# Patient Record
Sex: Female | Born: 1980 | Race: Black or African American | Hispanic: No | Marital: Single | Smoking: Never smoker
Health system: Southern US, Community
[De-identification: ages and names within clinical notes are randomized; demographics above are authoritative.]

## PROBLEM LIST (undated history)

## (undated) DIAGNOSIS — J339 Nasal polyp, unspecified: Secondary | ICD-10-CM

## (undated) DIAGNOSIS — F324 Major depressive disorder, single episode, in partial remission: Secondary | ICD-10-CM

## (undated) DIAGNOSIS — Z8489 Family history of other specified conditions: Secondary | ICD-10-CM

## (undated) DIAGNOSIS — G4733 Obstructive sleep apnea (adult) (pediatric): Secondary | ICD-10-CM

## (undated) DIAGNOSIS — K219 Gastro-esophageal reflux disease without esophagitis: Secondary | ICD-10-CM

## (undated) DIAGNOSIS — J45909 Unspecified asthma, uncomplicated: Secondary | ICD-10-CM

## (undated) HISTORY — DX: Major depressive disorder, single episode, in partial remission: F32.4

## (undated) HISTORY — DX: Unspecified asthma, uncomplicated: J45.909

## (undated) HISTORY — DX: Obstructive sleep apnea (adult) (pediatric): G47.33

## (undated) HISTORY — PX: EYE SURGERY: SHX253

## (undated) HISTORY — PX: FRACTURE SURGERY: SHX138

## (undated) HISTORY — DX: Nasal polyp, unspecified: J33.9

## (undated) HISTORY — DX: Morbid (severe) obesity due to excess calories: E66.01

## (undated) HISTORY — PX: NASAL POLYP EXCISION: SHX2068

---
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
## 2017-11-21 ENCOUNTER — Other Ambulatory Visit: Payer: Self-pay

## 2017-11-22 ENCOUNTER — Encounter

## 2017-11-22 ENCOUNTER — Encounter: Payer: Self-pay | Admitting: Internal Medicine

## 2017-11-22 ENCOUNTER — Ambulatory Visit (INDEPENDENT_AMBULATORY_CARE_PROVIDER_SITE_OTHER): Payer: 59 | Admitting: Internal Medicine

## 2017-11-22 DIAGNOSIS — G4733 Obstructive sleep apnea (adult) (pediatric): Secondary | ICD-10-CM | POA: Diagnosis not present

## 2017-11-22 DIAGNOSIS — F324 Major depressive disorder, single episode, in partial remission: Secondary | ICD-10-CM | POA: Insufficient documentation

## 2017-11-22 DIAGNOSIS — J4530 Mild persistent asthma, uncomplicated: Secondary | ICD-10-CM

## 2017-11-22 DIAGNOSIS — J339 Nasal polyp, unspecified: Secondary | ICD-10-CM

## 2017-11-22 DIAGNOSIS — F3341 Major depressive disorder, recurrent, in partial remission: Secondary | ICD-10-CM

## 2017-11-22 DIAGNOSIS — J45909 Unspecified asthma, uncomplicated: Secondary | ICD-10-CM | POA: Insufficient documentation

## 2017-11-22 DIAGNOSIS — E669 Obesity, unspecified: Secondary | ICD-10-CM | POA: Insufficient documentation

## 2017-11-22 MED ORDER — ALBUTEROL SULFATE HFA 108 (90 BASE) MCG/ACT IN AERS: 1.0000 | INHALATION_SPRAY | Freq: Four times a day (QID) | RESPIRATORY_TRACT | 3 refills | Status: DC | PRN

## 2017-11-22 MED ORDER — MONTELUKAST SODIUM 10 MG PO TABS: 10.0000 mg | ORAL_TABLET | Freq: Every day | ORAL | 3 refills | Status: DC

## 2017-11-22 MED ORDER — FLUTICASONE PROPIONATE 50 MCG/ACT NA SUSP: 2.0000 | Freq: Every day | NASAL | 12 refills | Status: DC

## 2017-11-22 NOTE — Patient Instructions (Signed)
Please check into the bariatric surgery program with [facility].

## 2017-11-22 NOTE — Assessment & Plan Note (Signed)
Will try fluticasone spray

## 2017-11-22 NOTE — Assessment & Plan Note (Signed)
Didn't do well with the CPAP Will pursue weight loss through surgery for now

## 2017-11-22 NOTE — Assessment & Plan Note (Signed)
Doing okay for now Discussed continuing the lexapro indefinitely Try to use the alprazolam prn only

## 2017-11-22 NOTE — Assessment & Plan Note (Signed)
Has considered surgery in the past---but now ready to pursue this Multiple co-morbidities and has tried numerous programs without lasting success Surgery is clearly indicated at this point Asked her to contact [facility] to look into their program

## 2017-11-22 NOTE — Assessment & Plan Note (Signed)
Mild and persistent---but mostly at night Hard to tell how much of this is the untreated sleep apnea Will try montelukast for now

## 2017-11-22 NOTE — Progress Notes (Signed)
Subjective:    Patient ID: Robyn Jennings, female    DOB: Jul 23, 1980, 37 y.o.   MRN: [id number]  HPI Here to establish care I see her parents  Has sleep apnea--stopped using the machine due to cleanliness Was getting lots of URIs from it Stopped 2 years ago  Has history of nasal polyps and asthma Uses the inhaler 1-2 times a day--mostly at night Doesn't remember steroid inhalers but has had montelukast (but never persistent Rx  Battling morbid obesity all her life Has lost over 100# twice--but has gained it back Is considering bariatric surgery Has tried Weight Watchers and other programs Has tried comprehensive programs--but results are not long lasting  Has history of depression and anxiety Has been seeing a therapist---keeping up with them via telecommunications Depression started in college Was admitted 2 years ago (suicidal ideation, etc) Has been using the alprazolam daily in the morning lately---with increased work stress  Has had symptomatic hypoglycemia in past Like after lunch  Current Outpatient Medications on File Prior to Visit  Medication Sig Dispense Refill  . albuterol (PROVENTIL HFA;VENTOLIN HFA) 108 (90 Base) MCG/ACT inhaler Inhale 1 puff into the lungs 4 (four) times daily as needed.    . ALPRAZolam (XANAX) 0.25 MG tablet Take 0.25 mg by mouth 2 (two) times daily as needed for anxiety.    Marland Kitchen EPINEPHrine (EPIPEN 2-PAK IJ) Inject as directed.    . escitalopram (LEXAPRO) 20 MG tablet Take 1 tablet by mouth daily.     No current facility-administered medications on file prior to visit.     Allergies  Allergen Reactions  . Other Anaphylaxis    Nuts--All Types  . Bupropion   . Cetirizine   . Venlafaxine     Past Medical History:  Diagnosis Date  . Asthma   . Major depression in partial remission (HCC)   . Morbid obesity (HCC)   . Nasal polyposis   . Obstructive sleep apnea     Past Surgical History:  Procedure Laterality Date  . ANKLE SURGERY Left  2003   drilling for new collagen  . NASAL POLYP EXCISION      Family History  Problem Relation Age of Onset  . Lupus Father   . Heart disease Father   . Diabetes Maternal Grandmother   . Heart disease Paternal Grandmother   . Lupus Brother   . Cancer Neg Hx     Social History   Socioeconomic History  . Marital status: Single    Spouse name: Not on file  . Number of children: 0  . Years of education: Not on file  . Highest education level: Not on file  Occupational History  . 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 Financial resource strain: Not on file  . Food insecurity:    Worry: Not on file    Inability: Not on file  . Transportation needs:    Medical: Not on file    Non-medical: Not on file  Tobacco Use  . Smoking status: Never Smoker  . Smokeless tobacco: Never Used  Substance and Sexual Activity  . Alcohol use: Not on file  . Drug use: Not on file  . Sexual activity: Not on file  Lifestyle  . Physical activity:    Days per week: Not on file    Minutes per session: Not on file  . Stress: Not on file  Relationships  . Social connections:    Talks  on phone: Not on file    Gets together: Not on file    Attends religious service: Not on file    Active member of club or organization: Not on file    Attends meetings of clubs or organizations: Not on file    Relationship status: Not on file  . Intimate partner violence:    Fear of current or ex partner: Not on file    Emotionally abused: Not on file    Physically abused: Not on file    Forced sexual activity: Not on file  Other Topics Concern  . Not on file  Social History Narrative  . Not on file   Review of Systems  Constitutional: Negative for fatigue.       Wears seat belt  HENT: Negative for dental problem and hearing loss.   Eyes: Negative for visual disturbance.       No diplopia or unilateral vision loss  Respiratory: Positive  for wheezing. Negative for cough and shortness of breath.   Cardiovascular: Negative for chest pain and leg swelling.       Palpitations only with severe anxiety  Gastrointestinal: Negative for abdominal pain, blood in stool and constipation.       Occasional heartburn---tums twice a week or so  Endocrine: Negative for polydipsia and polyuria.  Genitourinary: Negative for dysuria and hematuria.       Periods regular Abstinent   Musculoskeletal: Positive for arthralgias and back pain. Negative for joint swelling.       Right knee is worst place  Skin:       Hyperpigmentation on cheeks--uses a skin care regimen  Allergic/Immunologic: Positive for environmental allergies. Negative for immunocompromised state.  Neurological: Negative for dizziness, syncope, light-headedness and headaches.  Hematological: Negative for adenopathy. Does not bruise/bleed easily.  Psychiatric/Behavioral: Positive for dysphoric mood and sleep disturbance. The patient is nervous/anxious.        Objective:   Physical Exam  Constitutional: No distress.  HENT:  Mouth/Throat: Oropharynx is clear and moist. No oropharyngeal exudate.  Inflammation on left No clear polyp  Neck: No thyromegaly present.  Cardiovascular: Normal rate, regular rhythm, normal heart sounds and intact distal pulses. Exam reveals no gallop.  No murmur heard. Respiratory: Effort normal. No respiratory distress. She has no wheezes. She has no rales.  Slightly decreased breath sounds but clear Not tight  GI: Soft. There is no tenderness.  Musculoskeletal: She exhibits no edema or tenderness.  Lymphadenopathy:    She has no cervical adenopathy.  Skin: No rash noted. No erythema.  Psychiatric: She has a normal mood and affect. Her behavior is normal.           Assessment & Plan:

## 2017-12-26 ENCOUNTER — Telehealth: Payer: Self-pay | Admitting: Internal Medicine

## 2017-12-26 NOTE — Telephone Encounter (Signed)
Pt's father dropped off ppw to be filled out for [facility]. Advised that Alphonsus SiasLetvak is out of office until next week. Placed forms in RX tower.

## 2017-12-27 NOTE — Telephone Encounter (Signed)
Pt returned call to shannon. Pt stated a voicemail can be left.

## 2017-12-27 NOTE — Telephone Encounter (Signed)
Left message for pt to call office. I need to know weight loss attempts including dates, results, medications, diets, exercise modifications used.

## 2018-01-03 NOTE — Telephone Encounter (Signed)
Spoke to pt. Advised her I needed a list of diets, medications, and exercise programs she has tried and failed. She will write them down and send them to me in MyChart. I told her the more detail the better.

## 2018-01-12 NOTE — Telephone Encounter (Signed)
Forms up front ready for pick up.

## 2018-02-01 ENCOUNTER — Encounter: Payer: 59 | Attending: General Surgery | Admitting: Dietician

## 2018-02-01 VITALS — Ht 69.0 in | Wt >= 6400 oz

## 2018-02-01 DIAGNOSIS — E669 Obesity, unspecified: Secondary | ICD-10-CM | POA: Diagnosis not present

## 2018-02-01 NOTE — Patient Instructions (Addendum)
   Look into finding a multivitamin and vitamin D supplement if you have not already.   Begin working through the [employer] discussed today. We will check in next month and see the progress you have made!

## 2018-02-01 NOTE — Progress Notes (Signed)
Bariatric Pre-Op Nutrition Assessment Medical Nutrition Therapy  Appt Start Time: 2:00pm  End time: 3:10pm  Patient was seen on 02/01/2018 for Pre-Operative Nutrition Assessment. Assessment and letter of approval faxed to [facility] Bariatric Surgery Program coordinator on 02/01/2018.   Planned surgery: RYGB Pt expectation of surgery: Longevity with keeping weight off.  Pt expectation of dietitian: None stated.   Anthropometrics  Start weight at [facility]: 456.4 lbs (date: 02/01/2018) Height: 69 in BMI: 67.4 kg/m2    Clinical  Medical Hx: obesity, sleep apnea, asthma Surgeries: nasal polyp removal (2009), ankle surgery (2003) Medications: naproxen sodium, albuterol inhaler, alprazolam, lexapro, epi pen Allergies: nuts, fluticasone-salmeterol, cetirizine, bupropion, venlafaxine, Advair Diskus   Psychosocial/Lifestyle Pt received her graduate degree in education. Pt works as a Building surveyor. Pt lives alone but visits her parents often. Pt states she struggles with being the only one in her family who is obese, because her brothers and parents have a hard time relating to her and her weight. Pt has an extensive hx of dieting/trying weight loss programs through which she achieved weight loss but subsequent regain. Pt is kind, states she likes structure and goal setting, and is excited to have surgery.   24-Hr Dietary Recall First Meal: 3 scrambled eggs + sausage patties + water  Snack: none Second Meal: whole wheat penne pasta + meat sauce + green beans + pears + water Snack: cheese + crackers (or strawberries + pretzels, or celery + sunbutter)  Third Meal: beef (or chicken)  Snack: 1 Oreo (or a cookie)  Beverages: water + Minute Maid Peach Punch + Dr. Reino Kent + OJ + hot tea w/ honey   Food & Nutrition Related Hx Dietary Hx: Pt states she will usually eat out for dinner throughout the week and once on the weekends with her parents. Pt states she has done better with packing her  lunch for work (or eating what is provided at work) and eating breakfast at home rather than picking up fast food. 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 Pt states she sometimes drinks a Dr. Reino Kent with dinner, or Minute Maid juice/OJ with breakfast. Pt states she stopped drinking coffee recently. Pt states a cookie for dessert satisfies in the evening after dinner.  Estimated Daily Fluid Intake: 64 oz water + other beverages  Supplements: none GI / Other Notable Symptoms: heartburn (triggered by spicy foods, movement soon after eating)    Physical Activity  Current average weekly physical activity: none  Estimated Energy Needs Calories: 1600 Carbohydrate: 180g Protein: 120g Fat: 44g  Pre-Op Goals Reviewed with the Patient . Track food and beverage intake (try MyFitness Pal or the Baritastic app) . Make healthy food choices while monitoring portion sizes . Avoid concentrated sugars and fried foods . Keep fat & sugar in the single digits per serving on food labels . Practice CHEWING your food (aim for applesauce consistency) . Practice not drinking 15 minutes before, during, and 30 minutes after each meal and snack . Avoid all carbonated beverages (ex: soda, sparkling beverages)  . Limit caffeinated beverages (ex: coffee, tea, energy drinks) . Avoid all sugar-sweetened beverages (ex: regular soda, sports drinks)  . Avoid alcohol  . Consume 3 meals per day or try to eat every 3-5 hours . Make a list of non-food related activities .  Aim for 64-100 ounces of FLUID daily (with at least half of fluid intake being plain water)  . Aim for at least 60-80 grams of PROTEIN daily . Look for a liquid protein source that contains ?15 g protein and ?5 g carbohydrate (ex: shakes, drinks, shots) . Physical activity is an  important part of a healthy lifestyle so keep it moving! The goal is to reach 150 minutes of exercise per week, including cardiovascular and weight baring activity.  Handouts Provided Include  . Bariatric Surgery handouts (Nutrition Visits, Pre-Op Goals, Protein Shakes, Vitamins & Minerals, Support Group 2020 Schedule)  Learning Style & Readiness for Change Teaching method utilized: Visual & Auditory  Demonstrated degree of understanding via: Teach Back  Barriers to learning/adherence to lifestyle change: None Identified  RD's Notes for Next Visit . Assess progress made on Pre-Op Goals. Pt did not specify which goals she would like to start with during the visit, so check in and see.  . Provide meal and snack ideas, and general healthful nutrition education. During assessment, pt expressed her fear of gaining weight before surgery (which, per insurance, could disqualify her from having surgery.) Reinforce healthful habits and provide counseling as needed.   Next Steps Supervised Weight Loss (SWL) Visits Needed: 3  Patient is to return to [facility] in 1 month for 1st SWL Visit.  Patient is to call  [facility] to enroll in Pre-Op Class (>2 weeks before surgery) and Post-Op Class (2 weeks after surgery) for further nutrition education when surgery date is scheduled.

## 2018-02-05 ENCOUNTER — Ambulatory Visit (INDEPENDENT_AMBULATORY_CARE_PROVIDER_SITE_OTHER): Payer: 59

## 2018-02-05 ENCOUNTER — Other Ambulatory Visit: Payer: Self-pay

## 2018-02-05 ENCOUNTER — Ambulatory Visit
Admission: EM | Admit: 2018-02-05 | Discharge: 2018-02-05 | Disposition: A | Discharge status: Home / Self Care | Payer: 59 | Attending: Family Medicine | Admitting: Family Medicine

## 2018-02-05 ENCOUNTER — Telehealth: Payer: Self-pay | Admitting: Family Medicine

## 2018-02-05 DIAGNOSIS — M25562 Pain in left knee: Secondary | ICD-10-CM

## 2018-02-05 DIAGNOSIS — R030 Elevated blood-pressure reading, without diagnosis of hypertension: Secondary | ICD-10-CM | POA: Diagnosis not present

## 2018-02-05 MED ORDER — MELOXICAM 15 MG PO TABS: 15.0000 mg | ORAL_TABLET | Freq: Every day | ORAL | 0 refills | Status: DC | PRN

## 2018-02-05 NOTE — Discharge Instructions (Signed)
Xray was negative.  Rest. Try to avoid bending/stooping.  Medication as prescribed. If persists and does not improve you may need MRI to assess for meniscal injury. I would recommend seeing [facility] if it persists ([address] [place])  Take care  Dr. Adriana Simas

## 2018-02-05 NOTE — ED Provider Notes (Addendum)
[facility]    CSN: [id number] Arrival date & time: 02/05/18  0804  History   Chief Complaint Chief Complaint  Patient presents with  . Knee Pain   HPI  38 year old female presents with knee pain.  Patient reports a one-week history of left knee pain.  Patient reports that it started after she got out of bed and twisted her knee inward.  Patient states that her pain has continued to persist since that time.  She states that she is now having left calf pain as well.  No recent long travel.  She has tried Motrin, Tylenol, and elevation without improvement.  Exacerbated by activity.  Currently 8/10 in severity.  No relieving factors.  No other associated symptoms.  No other complaints.  PMH, Surgical Hx, Family Hx, Social History reviewed and updated as below.  Past Medical History:  Diagnosis Date  . Asthma   . Major depression in partial remission (HCC)   . Morbid obesity (HCC)   . Nasal polyposis   . Obstructive sleep apnea    Patient Active Problem List   Diagnosis Date Noted  . Asthma   . Morbid obesity (HCC)   . Obstructive sleep apnea   . Major depression in partial remission (HCC)   . Nasal polyposis    Past Surgical History:  Procedure Laterality Date  . ANKLE SURGERY Left 2003   drilling for new collagen  . EYE SURGERY Bilateral    PRK  . NASAL POLYP EXCISION      OB History   No obstetric history on file.    Home Medications    Prior to Admission medications   Medication Sig Start Date End Date Taking? Authorizing Provider  cholecalciferol (VITAMIN D3) 25 MCG (1000 UT) tablet Take 1,000 Units by mouth daily.   Yes [provider]  Multiple Vitamins-Minerals (WOMENS MULTIVITAMIN) TABS Take by mouth.   Yes [provider]  albuterol (PROVENTIL HFA;VENTOLIN HFA) 108 (90 Base) MCG/ACT inhaler Inhale 1 puff into the lungs 4 (four) times daily as needed. 11/22/17 01/23/19  Karie SchwalbeLetvak, Richard I, MD  ALPRAZolam Prudy Feeler(XANAX) 0.25 MG tablet  Take 0.25 mg by mouth 2 (two) times daily as needed for anxiety.    [provider]  EPINEPHrine (EPIPEN 2-PAK IJ) Inject as directed.    [provider]  escitalopram (LEXAPRO) 20 MG tablet Take 1 tablet by mouth daily. 06/15/17   [provider]  fluticasone (FLONASE) 50 MCG/ACT nasal spray Place 2 sprays into both nostrils daily. In each nostril 11/22/17   Karie SchwalbeLetvak, Richard I, MD  montelukast (SINGULAIR) 10 MG tablet Take 1 tablet (10 mg total) by mouth at bedtime. 11/22/17   Karie SchwalbeLetvak, Richard I, MD    Family History Family History  Problem Relation Age of Onset  . Lupus Father   . Heart disease Father   . Diabetes Maternal Grandmother   . Heart disease Paternal Grandmother   . Lupus Brother   . Cancer Neg Hx     Social History Social History   Tobacco Use  . Smoking status: Never Smoker  . Smokeless tobacco: Never Used  Substance Use Topics  . Alcohol use: Not Currently  . Drug use: Never     Allergies   Other; Bupropion; Cetirizine; and Venlafaxine   Review of Systems Review of Systems  Constitutional: Negative.   Musculoskeletal:       Left knee pain; calf pain.   Physical Exam Triage Vital Signs ED Triage Vitals  Enc Vitals  Group     BP 02/05/18 0817 (!) 155/97     Pulse Rate 02/05/18 0817 78     Resp 02/05/18 0817 18     Temp 02/05/18 0817 98.1 F (36.7 C)     Temp Source 02/05/18 0817 Oral     SpO2 02/05/18 0817 100 %     Weight --      Height --      Head Circumference --      Peak Flow --      Pain Score 02/05/18 0816 8     Pain Loc --      Pain Edu? --      Excl. in GC? --    Updated Vital Signs BP (!) 155/97 (BP Location: Right Arm)   Pulse 78   Temp 98.1 F (36.7 C) (Oral)   Resp 18   LMP 01/31/2018   SpO2 100%   Visual Acuity Right Eye Distance:   Left Eye Distance:   Bilateral Distance:    Right Eye Near:   Left Eye Near:    Bilateral Near:     Physical Exam Vitals signs and nursing note reviewed.    Constitutional:      General: She is not in acute distress. HENT:     Head: Normocephalic and atraumatic.     Right Ear: Tympanic membrane normal.     Left Ear: Tympanic membrane normal.  Cardiovascular:     Rate and Rhythm: Normal rate and regular rhythm.  Pulmonary:     Effort: Pulmonary effort is normal.     Breath sounds: No wheezing, rhonchi or rales.  Musculoskeletal:     Comments: Left knee -technically difficult exam due to body habitus.  No appreciable areas of tenderness.  No erythema.  No apparent effusion.  Neurological:     Mental Status: She is alert.  Psychiatric:        Mood and Affect: Mood normal.        Behavior: Behavior normal.    UC Treatments / Results  Labs (all labs ordered are listed, but only abnormal results are displayed) Labs Reviewed - No data to display  EKG None  Radiology Dg Knee Complete 4 Views Left  Result Date: 02/05/2018 CLINICAL DATA:  Twisted left knee 10 days ago and heard a pop. Still c/o pain. EXAM: LEFT KNEE - COMPLETE 4+ VIEW COMPARISON:  None. FINDINGS: No evidence of fracture, dislocation, or joint effusion. No evidence of arthropathy or other focal bone abnormality. Soft tissues are unremarkable. IMPRESSION: Negative. Electronically Signed   By: Bary RichardStan  Maynard M.D.   On: 02/05/2018 08:41    Procedures Procedures (including critical care time)  Medications Ordered in UC Medications - No data to display  Initial Impression / Assessment and Plan / UC Course  I have reviewed the triage vital signs and the nursing notes.  Pertinent labs & imaging results that were available during my care of the patient were reviewed by me and considered in my medical decision making (see chart for details).    38 year old female presents with acute left knee pain.  X-rays negative.  Advised rest and avoidance of activities that may worsen symptoms.  Meloxicam as prescribed.  If she fails improve or worsens, she should see orthopedics as  outlined in my after visit instructions.  Final Clinical Impressions(s) / UC Diagnoses   Final diagnoses:  Acute pain of left knee     Discharge Instructions     Xray was negative.  Rest. Try to avoid bending/stooping.  Medication as prescribed. If persists and does not improve you may need MRI to assess for meniscal injury. I would recommend seeing [facility] if it persists ([address] [place])  Take care  Dr. Adriana Simas    ED Prescriptions    None     Controlled Substance Prescriptions [place] Controlled Substance Registry consulted? Not Applicable   Tommie Sams, DO 02/05/18 0913    Tommie Sams, DO 02/05/18 1312

## 2018-02-05 NOTE — Telephone Encounter (Signed)
Rx sent (apparently did not get sent during encounter).  Everlene Other DO [facility]

## 2018-02-05 NOTE — ED Triage Notes (Signed)
Pt was getting out of bed this week and her left knee twisted and she heard cracking (she reports the cracking happens a lot). Limping gait in triage. Pain 8/10

## 2018-03-04 ENCOUNTER — Other Ambulatory Visit: Payer: Self-pay | Admitting: Family Medicine

## 2018-03-06 ENCOUNTER — Encounter: Payer: Self-pay | Admitting: Dietician

## 2018-03-06 ENCOUNTER — Other Ambulatory Visit: Payer: Self-pay | Admitting: Internal Medicine

## 2018-03-06 ENCOUNTER — Encounter: Payer: 59 | Attending: General Surgery | Admitting: Dietician

## 2018-03-06 VITALS — Wt >= 6400 oz

## 2018-03-06 DIAGNOSIS — E669 Obesity, unspecified: Secondary | ICD-10-CM | POA: Insufficient documentation

## 2018-03-06 NOTE — Patient Instructions (Addendum)
Continue working through the [employer], focusing on these goals this next month:   Aim for 60 grams of protein per day (getting in more earlier in the day with breakfast/lunch/snacks and from lean sources)   Practice not drinking 15 minutes before, during, and 30 minutes after each meal and snack  Increase physical activity throughout the week. The goal is to reach 150 minutes of exercise per week, including cardiovascular and weight baring activity.  Keep up the GREAT work! Amazing what you have accomplished within the last month.

## 2018-03-06 NOTE — Progress Notes (Signed)
Bariatric Supervised Weight Loss Visit Appt Start Time: 4:50pm  End Time: 5:35pm  Planned Surgery: RYGB   1st out of 3 SWL Appointments   NUTRITION ASSESSMENT  Anthropometrics  Start weight at [facility]: 456.4 lbs (date: 02/01/2018) Today's weight: 455.8 lbs Weight change: -0.6 lbs (since previous visit on 02/01/2018) BMI: 67.28 kg/m2    Clinical  Medical Hx: obesity, sleep apnea, asthma Medications: see list  Psychosocial/Lifestyle Pt received her graduate degree in education. Pt works as a Building surveyor. Pt lives alone but visits her parents often. Pt states she struggles with being the only one in her family who is obese, because her brothers and parents have a hard time relating to her and her weight. Pt has an extensive hx of dieting/trying weight loss programs through which she achieved weight loss but subsequent regain. Pt is kind, states she likes structure and goal setting, and is excited to have surgery.   24-Hr Dietary Recall First Meal: egg muffin (2 eggs + onions + green peppers + sausage + mozzarella cheese) (or protein shake + soy milk)  Snack: skips Second Meal: baked chicken + mashed sweet potatoes + pineapple + broccoli + water Snack: cheese + crackers (hummus + carrots)  Third Meal: pork chop + salad + vegetables  Snack: skips  Beverages: water + coffee + Crystal Light   Food & Nutrition Related Hx Dietary Hx: Pt states she has been eating out a lot less frequently, only eating out dinner with her family on the weekends. Pt states she is continuing to improve on packing her lunch for work and making good food choices at work when she does eat school lunch. Pt states she only drinks soda 1 to 2 times per week. Pt states she only drinks coffee twice per week, and she really enjoys it now (whereas before she just drank it for caffeine.) Pt states her family always eats pizza on Fridays, but recently they instead went to a sit down restaurant with healthy options when pt let  her family know she was working on making dietary changes. Pt states she would like to incorporate more protein so she can meet her 60g/day goal, ideally adding more into breakfast/lunch. Pt states she feels better by eating breakfast, lunch, and a late dinner, and that making good food choices helps her feel good the next day.  Estimated Daily Fluid Intake: 64+ oz Supplements: Women's One a Day MVI, vitamin D3 GI / Other Notable Symptoms: none stated   Physical Activity  Current average weekly physical activity: gym once/week (fitness center at apartment); plans to start swimming twice per week   Estimated Energy Needs Calories: 1600 Carbohydrate: 180g Protein: 120g Fat: 44g   NUTRITION DIAGNOSIS  Overweight/obesity ([place]-3.3) related to past poor dietary habits and physical inactivity as evidenced by patient w/ planned RYGB surgery following dietary guidelines for continued weight loss.   NUTRITION INTERVENTION  Nutrition counseling (C-1) and education (E-2) to facilitate bariatric surgery goals.  Pre-Op Goals Progress & New Goals . Less inflammation/ feels better by eating homemade food  . Less coffee and soda  . Working out a few times a month (going to fitness center at apartment)  . Going to start the pool 2x/week  . Tracking food intake  . Started taking MVI and vitamin D3 . GOAL: Planning protein in the beginning of the day (from lean sources) and hitting 60g/day  . GOAL: Practice not drinking 15 minutes before, during, and 30 minutes after each meal and snack  .  GOAL: To reach 150 minutes of exercise per week, including cardiovascular and weight baring activity  Handouts Provided Include   MyPlate   Meal Ideas   Breakfast Ideas (per pt request)   Plant Protein   Types of Fat (per pt request)   Learning Style & Readiness for Change Teaching method utilized: Visual & Auditory  Demonstrated degree of understanding via: Teach Back  Barriers to learning/adherence to  lifestyle change: None Identified   RD's Notes for next Visit   Check in on progress made with new goals set. Pt likes structure and accountability.   During assessment, pt expressed her fear of gaining weight before surgery (which, per insurance, could disqualify her from having surgery.) Reinforce healthful habits and provide counseling as needed.    MONITORING & EVALUATION Dietary intake, weekly physical activity, body weight, and pre-op goals in 1 month.   Next Steps  Patient is to return to [facility] in 1 month for 2nd SWL.

## 2018-03-16 ENCOUNTER — Ambulatory Visit: Payer: 59 | Admitting: Family Medicine

## 2018-03-16 ENCOUNTER — Ambulatory Visit
Admission: RE | Admit: 2018-03-16 | Discharge: 2018-03-16 | Disposition: A | Discharge status: Home / Self Care | Payer: 59 | Source: Ambulatory Visit | Attending: Family Medicine | Admitting: Family Medicine

## 2018-03-16 ENCOUNTER — Encounter: Payer: Self-pay | Admitting: Family Medicine

## 2018-03-16 ENCOUNTER — Other Ambulatory Visit: Payer: Self-pay

## 2018-03-16 VITALS — BP 128/80 | HR 73 | Temp 98.6°F | Ht 69.0 in

## 2018-03-16 DIAGNOSIS — M79669 Pain in unspecified lower leg: Secondary | ICD-10-CM

## 2018-03-16 MED ORDER — TIZANIDINE HCL 4 MG PO TABS: 4.0000 mg | ORAL_TABLET | Freq: Four times a day (QID) | ORAL | 0 refills | Status: DC | PRN

## 2018-03-16 NOTE — Patient Instructions (Signed)
Go see Shirlee Limerick on the way out.  Assuming your ultrasound is negative, then start using tizanidine for calf pain and use ice on the back of your knee.   Take care.  Glad to see you.

## 2018-03-16 NOTE — Progress Notes (Signed)
L knee pain.  About 1.5 months ago the pain started.  Seen at [facility], started on meloxicam, did well for about 3 weeks. Worse in the last week.  Pain getting out of bed.  Pain sitting down.  Pain bending the knee.  Locally puffy.  No bruising.  Posterior pain, popliteal area, and also anterior at the knee cap.  No trauma.  H/o audible knee popping at baseline, longstanding.    FH DVT but patient has no hx.    Meds, vitals, and allergies reviewed.   ROS: Per HPI unless specifically indicated in ROS section   nad obese ncat rrr ctab abd soft.  B 65cm calf circumference.   L knee ttp anterior and posterior but joint line not ttp.  No crepitus on ROM L knee Normal ROM  L knee.  L calf ttp w/o bruising.

## 2018-03-18 ENCOUNTER — Encounter: Payer: Self-pay | Admitting: Nurse Practitioner

## 2018-03-18 ENCOUNTER — Ambulatory Visit: Payer: 59 | Admitting: Nurse Practitioner

## 2018-03-18 VITALS — BP 226/78 | HR 64 | Temp 98.0°F | Resp 16

## 2018-03-18 DIAGNOSIS — M25562 Pain in left knee: Secondary | ICD-10-CM

## 2018-03-18 DIAGNOSIS — S8992XD Unspecified injury of left lower leg, subsequent encounter: Secondary | ICD-10-CM

## 2018-03-18 MED ORDER — TRAMADOL HCL 50 MG PO TABS: 50.0000 mg | ORAL_TABLET | Freq: Two times a day (BID) | ORAL | 0 refills | Status: AC | PRN

## 2018-03-18 NOTE — Progress Notes (Signed)
Subjective:  Patient ID: Robyn Jennings, female    DOB: August 20, 1980  Age: 38 y.o. MRN: [id number]  CC: Leg Pain (Complaind of left leg pain,more behind the knee. This started about 2 weeks ago, was seen Thursday, pain not better. )  Knee Pain   The incident occurred more than 1 week ago. The incident occurred at home. The injury mechanism was a twisting injury. The pain is present in the left knee. The quality of the pain is described as aching. The pain has been constant since onset. Associated symptoms include an inability to bear weight. Pertinent negatives include no loss of motion, loss of sensation, muscle weakness, numbness or tingling. The symptoms are aggravated by palpation, weight bearing and movement. She has tried NSAIDs for the symptoms. The treatment provided mild relief.    knee injury 3weeks ago, heard loud pop sound when knee twisted. Worsening pain. Worse with prolong stand and bending knee. No recent travel, no immobilization minimal improvement with meloxicam. No improvement with muscle relaxant. Normal knee x-ray and negative venous doppler.  Reviewed past Medical, Social and Family history today.  Outpatient Medications Prior to Visit  Medication Sig Dispense Refill  . albuterol (PROVENTIL HFA;VENTOLIN HFA) 108 (90 Base) MCG/ACT inhaler Inhale 1 puff into the lungs 4 (four) times daily as needed. 18 g 3  . ALPRAZolam (XANAX) 0.25 MG tablet Take 0.25 mg by mouth 2 (two) times daily as needed for anxiety.    . cholecalciferol (VITAMIN D3) 25 MCG (1000 UT) tablet Take 1,000 Units by mouth daily.    Marland Kitchen EPINEPHrine (EPIPEN 2-PAK IJ) Inject as directed.    . escitalopram (LEXAPRO) 20 MG tablet Take 1 tablet by mouth daily.    . fluticasone (FLONASE) 50 MCG/ACT nasal spray Place 2 sprays into both nostrils daily. In each nostril 16 g 12  . meloxicam (MOBIC) 15 MG tablet TAKE 1 TABLET (15 MG TOTAL) BY MOUTH DAILY AS NEEDED. 30 tablet 2  . montelukast (SINGULAIR) 10 MG tablet Take 1  tablet (10 mg total) by mouth at bedtime. 90 tablet 3  . Multiple Vitamins-Minerals (WOMENS MULTIVITAMIN) TABS Take by mouth.    Marland Kitchen tiZANidine (ZANAFLEX) 4 MG tablet Take 1 tablet (4 mg total) by mouth every 6 (six) hours as needed for muscle spasms. 30 tablet 0   No facility-administered medications prior to visit.     ROS See HPI  Objective:  BP (!) 226/78 (BP Location: Left Arm, Patient Position: Sitting, Cuff Size: Large)   Pulse 64   Temp 98 F (36.7 C) (Oral)   Resp 16   SpO2 95%   BP Readings from Last 3 Encounters:  03/18/18 (!) 226/78  03/16/18 128/80  02/05/18 (!) 155/97    Wt Readings from Last 3 Encounters:  03/06/18 (!) 455 lb 9.6 oz (206.7 kg)  02/01/18 (!) 456 lb 8 oz (207.1 kg)  11/22/17 (!) 443 lb (200.9 kg)    Physical Exam Vitals signs reviewed.  Cardiovascular:     Rate and Rhythm: Normal rate.     Pulses: Normal pulses.  Pulmonary:     Effort: Pulmonary effort is normal.  Musculoskeletal:        General: Tenderness and signs of injury present. No swelling or deformity.     Left knee: She exhibits normal range of motion, no swelling, no effusion, no erythema and no bony tenderness. Tenderness found. Medial joint line tenderness noted. No lateral joint line and no patellar tendon tenderness noted.  Left ankle: Normal.     Left lower leg: Normal.     Left foot: Normal.  Skin:    Findings: No erythema or rash.  Neurological:     Mental Status: She is alert and oriented to person, place, and time.     No results found for: WBC, HGB, HCT, PLT, GLUCOSE, CHOL, TRIG, HDL, LDLDIRECT, LDLCALC, ALT, AST, NA, K, CL, CREATININE, BUN, CO2, TSH, PSA, INR, GLUF, HGBA1C, MICROALBUR  US Venous Img Lower Unilateral Left  Result Date: 03/16/2018 CLINICAL DATA:  Left calf and knee pain EXAM: LEFT LOWER EXTREMITY VENOUS DOPPLER ULTRASOUND TECHNIQUE: Gray-scale sonography with graded compression, as well as color Doppler and duplex ultrasound were performed to  evaluate the lower extremity deep venous systems from the level of the common femoral vein and including the common femoral, femoral, profunda femoral, popliteal and calf veins including the posterior tibial, peroneal and gastrocnemius veins when visible. The superficial great saphenous vein was also interrogated. Spectral Doppler was utilized to evaluate flow at rest and with distal augmentation maneuvers in the common femoral, femoral and popliteal veins. COMPARISON:  None. FINDINGS: Limited exam because of obese body habitus. Contralateral Common Femoral Vein: Respiratory phasicity is normal and symmetric with the symptomatic side. No evidence of thrombus. Normal compressibility. Common Femoral Vein: No evidence of thrombus. Normal compressibility, respiratory phasicity and response to augmentation. Saphenofemoral Junction: No evidence of thrombus. Normal compressibility and flow on color Doppler imaging. Profunda Femoral Vein: No evidence of thrombus. Normal compressibility and flow on color Doppler imaging. Femoral Vein: No evidence of thrombus. Normal compressibility, respiratory phasicity and response to augmentation. Limited assessment of the distal femoral vein because of body habitus. Popliteal Vein: No evidence of thrombus. Normal compressibility, respiratory phasicity and response to augmentation. Calf Veins: Limited assessment of the calf tibial and peroneal veins because of body habitus. No large occlusive thrombus. Superficial Great Saphenous Vein: No evidence of thrombus. Normal compressibility. Venous Reflux:  None. Other Findings:  None. IMPRESSION: No significant left lower extremity femoral popliteal DVT. Limited assessment of the calf veins secondary to body habitus. Electronically Signed   By: Judie Petit.  Shick M.D.   On: 03/16/2018 12:23    Assessment & Plan:   Vali was seen today for leg pain.  Diagnoses and all orders for this visit:  Acute pain of left knee -     MR KNEE LEFT WO  CONTRAST; Future -     AMB referral to orthopedics -     traMADol (ULTRAM) 50 MG tablet; Take 1 tablet (50 mg total) by mouth every 12 (twelve) hours as needed for up to 3 days.  Injury of left knee, subsequent encounter -     MR KNEE LEFT WO CONTRAST; Future -     AMB referral to orthopedics -     traMADol (ULTRAM) 50 MG tablet; Take 1 tablet (50 mg total) by mouth every 12 (twelve) hours as needed for up to 3 days.   I am having Brittnee Depaulo start on traMADol. I am also having her maintain her escitalopram, ALPRAZolam, EPINEPHrine (EPIPEN 2-PAK IJ), montelukast, fluticasone, albuterol, Womens Multivitamin, cholecalciferol, meloxicam, and tiZANidine.  Meds ordered this encounter  Medications  . traMADol (ULTRAM) 50 MG tablet    Sig: Take 1 tablet (50 mg total) by mouth every 12 (twelve) hours as needed for up to 3 days.    Dispense:  6 tablet    Refill:  0    Order Specific Question:   Supervising Provider  Answer:   BEDSOLE, AMY E [[id number]]    Problem List Items Addressed This Visit    None    Visit Diagnoses    Acute pain of left knee    -  Primary   Relevant Medications   traMADol (ULTRAM) 50 MG tablet   Other Relevant Orders   MR KNEE LEFT WO CONTRAST   AMB referral to orthopedics   Injury of left knee, subsequent encounter       Relevant Medications   traMADol (ULTRAM) 50 MG tablet   Other Relevant Orders   MR KNEE LEFT WO CONTRAST   AMB referral to orthopedics       Follow-up: No follow-ups on file.  Alysia Penna, NP

## 2018-03-18 NOTE — Patient Instructions (Addendum)
Alternate between meloxicam and tramadol for pain. Do not take tramadol or tizanidine if you plan to drive. Take meloxicam with food. You will be contacted to schedule appt with ortho and for knee MRI.

## 2018-03-19 DIAGNOSIS — M79669 Pain in unspecified lower leg: Secondary | ICD-10-CM | POA: Insufficient documentation

## 2018-03-19 NOTE — Assessment & Plan Note (Signed)
We needed to exclude DVT.  Ultrasound done.  Report discussed with patient via phone. Ultrasound is negative, so start using tizanidine for calf pain and use ice on the back of the knee.   Follow-up with PCP as needed.

## 2018-03-20 ENCOUNTER — Telehealth: Payer: Self-pay

## 2018-03-20 NOTE — Telephone Encounter (Signed)
[facility] Night - Client TELEPHONE ADVICE RECORD [facility] Medical Call Center Patient Name: Robyn Jennings Gender: Female DOB: October 07, 1980 Age: 38 Y 6 M 21 D Return Phone Number: [phone] (Primary) Address: [place] [address] Client [facility] Night - Client Client Site [facility] [place] - Night Physician AA - PHYSICIAN, NOT LISTED- MD Contact Type Call Who Is Calling Patient / Member / Family / Caregiver Call Type Triage / Clinical Relationship To Patient Self Return Phone Number [phone] (Primary) Chief Complaint Walking difficulty Reason for Call Symptomatic / Request for Health Information Initial Comment Caller states she has stiffness in her knees and ankles. She has calf pain and is having trouble walking Provider Cheree Ditto Translation No Nurse Assessment Nurse: Lane Hacker, RN, Elvin So Date/Time (Eastern Time): 03/18/2018 8:52:37 AM Confirm and document reason for call. If symptomatic, describe symptoms. ---Caller states she has stiffness in her knees/ankles, knee and calf pain and is having trouble walking. Rates calf pain at 7/10, knee pain 8/10, and worsening pain behind the knee initially with walking. Unable to sleep at night d/t the pain. -- No back pain. No swelling in ankles or knees. -- S/S started 1 & 1/2 months ago. Seen at UC initially, and given anti inflammatory meds. This helped for about 3 wks, but in last 1-2 wks, pain has returned despite taking the meds. Seen by MD on Thursday. MD wanted to r/o DVT, and u/s done same day and negative for DVT. Given muscle relaxers, and they aren't helping. Has the patient traveled to [place], [place], [place], [place], or [place] OR had close contact with a person known to have the novel coronavirus illness in the last 14 days? ---Not Applicable Does the patient have any new or worsening symptoms? ---Yes Will a triage be completed?  ---Yes Related visit to physician within the last 2 weeks? ---Yes Does the PT have any chronic conditions? (i.e. diabetes, asthma, this includes High risk factors for pregnancy, etc.) ---No Is the patient pregnant or possibly pregnant? (Ask all females between the ages of 69-55) ---No Is this a behavioral health or substance abuse call? ---No Guidelines Guideline Title Affirmed Question Affirmed Notes Nurse Date/Time Lamount Cohen Time) Knee Pain [1] SEVERE pain (e.g., excruciating, unable Dillon, Charity fundraiser, Windy 03/18/2018 8:57:02 AM PLEASE NOTE: All timestamps contained within this report are represented as [place] Standard Time. CONFIDENTIALTY NOTICE: This fax transmission is intended only for the addressee. It contains information that is legally privileged, confidential or otherwise protected from use or disclosure. If you are not the intended recipient, you are strictly prohibited from reviewing, disclosing, copying using or disseminating any of this information or taking any action in reliance on or regarding this information. If you have received this fax in error, please notify us immediately by telephone so that we can arrange for its return to [place]. Phone: [phone], Toll-Free: [phone], Fax: [phone] Page: 2 of 2 Call Id: [id number] Guidelines Guideline Title Affirmed Question Affirmed Notes Nurse Date/Time Lamount Cohen Time) to walk) AND [2] not improved after 2 hours of pain medicine Disp. Time Lamount Cohen Time) Disposition Final User 03/18/2018 8:59:40 AM See HCP within 4 Hours (or PCP triage) Yes Lane Hacker, RN, Coralyn Mark Disagree/Comply Comply Caller Understands Yes PreDisposition Go to Urgent Care/Walk-In Clinic Care Advice Given Per Guideline SEE HCP WITHIN 4 HOURS (OR PCP TRIAGE): * IF OFFICE WILL BE OPEN: You need to be seen within the next 3 or 4 hours. Call your doctor (or NP/PA) now  or as soon as the office opens. BRING MEDICINES: * Please bring a list of your  current medicines when you go to see the doctor. CALL BACK IF: * You become worse. CARE ADVICE given per Knee Pain (Adult) guideline. Referrals [facility]

## 2018-03-20 NOTE — Telephone Encounter (Signed)
okay

## 2018-03-20 NOTE — Telephone Encounter (Signed)
Pt was seen at [facility] on 03/18/18.

## 2018-03-21 ENCOUNTER — Encounter: Payer: Self-pay | Admitting: Nurse Practitioner

## 2018-03-21 ENCOUNTER — Other Ambulatory Visit: Payer: Self-pay | Admitting: Orthopedic Surgery

## 2018-03-21 DIAGNOSIS — M25562 Pain in left knee: Principal | ICD-10-CM

## 2018-03-21 DIAGNOSIS — G8929 Other chronic pain: Secondary | ICD-10-CM

## 2018-03-28 ENCOUNTER — Telehealth: Payer: Self-pay

## 2018-03-28 ENCOUNTER — Ambulatory Visit
Admission: RE | Admit: 2018-03-28 | Discharge: 2018-03-28 | Disposition: A | Discharge status: Home / Self Care | Payer: 59 | Source: Ambulatory Visit | Attending: Orthopedic Surgery | Admitting: Orthopedic Surgery

## 2018-03-28 ENCOUNTER — Other Ambulatory Visit: Payer: Self-pay

## 2018-03-28 DIAGNOSIS — M25562 Pain in left knee: Principal | ICD-10-CM

## 2018-03-28 DIAGNOSIS — G8929 Other chronic pain: Secondary | ICD-10-CM

## 2018-03-28 NOTE — Telephone Encounter (Signed)
Unable to reach pt for update.left /vm requesting cb .

## 2018-03-28 NOTE — Telephone Encounter (Signed)
[facility] Night - Client TELEPHONE ADVICE RECORD [facility] Medical Call Center Patient Name: Robyn Jennings Gender: Female DOB: [birth year] Age: 39 Y 7 M 2 D Return Phone Number: [phone] (Primary) Address: [place] [address] Client [facility] Night - Client Client Site [facility] - Night Physician Tillman Abide - MD Contact Type Call Who Is Calling Patient / Member / Family / Caregiver Call Type Triage / Clinical Relationship To Patient Self Return Phone Number [phone] (Primary) Chief Complaint BREATHING - shortness of breath or sounds breathless Reason for Call Symptomatic / Request for Health Information Initial Comment She is having resp issues (shortness of breath) and a fever of 99.9 (oral). Translation No Nurse Assessment Nurse: Phebe Colla, RN, Dondra Spry Date/Time Lamount Cohen Time): 03/28/2018 6:25:01 AM Confirm and document reason for call. If symptomatic, describe symptoms. ---She is having resp issues (shortness of breath,mild) and a fever of 99.9 (oral). started a at 2200. dry cough, no runny nose. has albuterol mdi. has taken it twice. Has the patient traveled to [place], [place], [place], [place], or [place] OR had close contact with a person known to have the novel coronavirus illness in the last 14 days? ---No Does the patient have any new or worsening symptoms? ---Yes Will a triage be completed? ---Yes Related visit to physician within the last 2 weeks? ---N/A Does the PT have any chronic conditions? (i.e. diabetes, asthma, this includes High risk factors for pregnancy, etc.) ---Yes List chronic conditions. ---asthma Is the patient pregnant or possibly pregnant? (Ask all females between the ages of 75-55) ---No Is this a behavioral health or substance abuse call? ---No Nurse: Laural Benes, RN, Dondra Spry Date/Time (Eastern Time): 03/28/2018 7:29:45 AM Confirm and document reason for call. If  symptomatic, describe symptoms. ---Was having shortness of breath; states she followed Nurse Gail's advise; used the inhaler now and then 20 minutes later; States breathing is so much better Has the patient traveled to [place], [place], [place], [place], or [place] OR had close contact with a person known to have the novel coronavirus illness in the last 14 days? ---No Does the

## 2018-03-31 ENCOUNTER — Encounter: Payer: Self-pay | Admitting: Dietician

## 2018-03-31 ENCOUNTER — Encounter: Payer: 59 | Attending: General Surgery | Admitting: Dietician

## 2018-03-31 ENCOUNTER — Other Ambulatory Visit: Payer: Self-pay

## 2018-03-31 VITALS — Wt >= 6400 oz

## 2018-03-31 DIAGNOSIS — E669 Obesity, unspecified: Secondary | ICD-10-CM | POA: Diagnosis not present

## 2018-03-31 NOTE — Progress Notes (Signed)
Bariatric Supervised Weight Loss Visit Appt Start Time: 7:30am  End Time: 8:00am  Planned Surgery: RYGB   2nd out of 3 SWL Appointments   Pt arrived to appointment very upset and fearful because of several situations that have come up over the past month. Pt states she visited the doctor a few weeks ago at which time they discovered a torn meniscus in her knee as well as a cyst and arthritis. Pt has been experiencing pain preventing her from even walking. Pt states her doctor also advised her to stay home from work dt the severity of the condition of her knee, and recommended surgery ASAP. However, the COVID-19 pandemic has interfered with this process along with physical therapy visits, as well as potentially altered her anticipated bariatric surgery date. 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 Therefore, pt states she fears not being able to financially cover everything as well as how long she must wait before she is able to have her surgeries.     NUTRITION ASSESSMENT  Anthropometrics  Start weight at [facility]: 456.4 lbs (date: 02/01/2018) Today's weight: 459 lbs Weight change: +3.2 lbs (since previous visit on 03/06/2018) BMI: 67.78 kg/m2    Clinical  Medical Hx: obesity, sleep apnea, asthma Medications: see list  Psychosocial/Lifestyle Pt received her graduate degree in education. Pt works as a Building surveyor. Pt lives alone but visits her parents often. Pt states she struggles with being the only one in her family who is obese, because her brothers and parents have a hard time relating to her and her weight. Pt has an extensive hx of dieting/trying weight loss programs through which she achieved weight loss but subsequent regain. Pt is kind, states she likes structure and goal setting, and is excited to have surgery.   24-Hr Dietary Recall First Meal: espresso shots + Premier  Protein shake  Snack: fruit Second Meal: baked chicken + mashed sweet potatoes + pineapple + broccoli + water Snack: cheese + crackers (hummus + carrots)  Third Meal: pork chop + salad + vegetables  Snack: skips  Beverages: water + coffee w/ Premier Protein + Crystal Light   Food & Nutrition Related Hx Dietary Hx: Pt states she has been eating out a lot less frequently, only eating out dinner with her family on the weekends. Pt states she is continuing to improve on packing her lunch for work and making good food choices at work when she does eat school lunch. Pt states she only drinks soda 1 to 2 times per week. Pt states she only drinks coffee twice per week, and she really enjoys it now (whereas before she just drank it for caffeine.) Pt states her family always eats pizza on Fridays, but recently they instead went to a sit down restaurant with healthy options when pt let her family know she was working on making dietary changes. Pt states she would like to incorporate more protein so she can meet her 60g/day goal, ideally adding more into breakfast/lunch. Pt states she feels better by eating breakfast, lunch, and a late dinner, and that making good food choices helps her feel good the next day.  Pt states she no longer drinks soda at fast food restaurants, but orders a large water instead. Her mom has been helping her meal prep soups, which keeps her from having to stop and pick up food on the way  home. Pt has incorporated protein shakes into her day by adding in 2 shots of espresso for her breakfast in the mornings. She states this helps curb her appetite and wakes her up, since her pain medications cause her to feel groggy.  Estimated Daily Fluid Intake: 64+ oz Supplements: Women's One a Day MVI, vitamin D3 GI / Other Notable Symptoms: none stated   Physical Activity  Current average weekly physical activity: none recently, dt knee pain and COVID-19 causing her gym and apartment's fitness  facility to close (previously would go to the gym once/week and hopes to eventually start swimming twice per week)  Estimated Energy Needs Calories: 1600 Carbohydrate: 180g Protein: 120g Fat: 44g   NUTRITION DIAGNOSIS  Overweight/obesity ([place]-3.3) related to past poor dietary habits and physical inactivity as evidenced by patient w/ planned RYGB surgery following dietary guidelines for continued weight loss.   NUTRITION INTERVENTION  Nutrition counseling (C-1) and education (E-2) to facilitate bariatric surgery goals.  Pre-Op Goals Progress & New Goals . Less inflammation/ feels better by eating homemade food  . Less coffee and soda  . Working out a few times a month (going to fitness center at apartment, as long as it is open)  . Going to start the pool 2x/week once they reopen . Tracking food intake  . Taking MVI and vitamin D3 . Getting in protein in the beginning of the day (from lean sources) and hitting 60g/day  . Practicing not drinking 15 minutes before, during, and 30 minutes after each meal and snack. Doesn't drink before or after meals, but takes a few sips during meals.   Marland Kitchen GOAL: To accommodate knee pain, look into chair exercises such as online videos for cardio chair exercises.   Learning Style & Readiness for Change Teaching method utilized: Visual & Auditory  Demonstrated degree of understanding via: Teach Back  Barriers to learning/adherence to lifestyle change: None Identified   RD's Notes for next Visit   Check in on progress made with new goals set. Pt likes structure and accountability.   During assessment, pt expressed her fear of gaining weight before surgery (which, per insurance, could disqualify her from having surgery.) Reinforce healthful habits and provide counseling as needed.    MONITORING & EVALUATION Dietary intake, weekly physical activity, body weight, and pre-op goals in 1 month.   Next Steps  Patient is to return to [facility] in 1 month for  3rd SWL.

## 2018-03-31 NOTE — Patient Instructions (Signed)
Look into chair exercises online (maybe try "cardio chair exercises" on YouTube.)   Keep up the progress you have made thus far on your goals, you've come a long way! Great job at getting more protein in earlier in the day and having prepped meals at home ready for you after work. See you at your final supervised appointment!

## 2018-05-03 ENCOUNTER — Ambulatory Visit: Payer: Self-pay | Admitting: Dietician

## 2018-05-05 ENCOUNTER — Other Ambulatory Visit: Payer: Self-pay | Admitting: General Surgery

## 2018-05-11 ENCOUNTER — Encounter: Payer: 59 | Attending: General Surgery | Admitting: Skilled Nursing Facility1

## 2018-05-11 ENCOUNTER — Other Ambulatory Visit: Payer: Self-pay

## 2018-05-11 DIAGNOSIS — E669 Obesity, unspecified: Secondary | ICD-10-CM | POA: Insufficient documentation

## 2018-05-11 NOTE — Progress Notes (Signed)
Bariatric Supervised Weight Loss Visit Appt Start Time: 7:30am  End Time: 8:00am  Planned Surgery: RYGB   3rd out of 3 SWL Appointments   Pt arrived to appointment very upset and fearful because of several situations that have come up over the past month. Pt states she visited the doctor a few weeks ago at which time they discovered a torn meniscus in her knee as well as a cyst and arthritis. Pt has been experiencing pain preventing her from even walking. Pt states her doctor also advised her to stay home from work dt the severity of the condition of her knee, and recommended surgery ASAP. However, the COVID-19 pandemic has interfered with this process along with physical therapy visits, as well as potentially altered her anticipated bariatric surgery date. 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 Therefore, pt states she fears not being able to financially cover everything as well as how long she must wait before she is able to have her surgeries. Pt states her baraitric surgeyr has been put off for 6 months which has upset her.  Pt arrives Nervous her weight has gone up. Has Been getting shots and doing physical therapy. Increased work with therapist due to stress. Very stressed. Sleeping a lot as a way to cope. Pt states she had an open wound from a  Boil and stopped her antibiotic after 3 days due to stomach pains; she still has this open wound which is bleeding a pussing. Pt states she has been seeing her therapist for years via televisits from [place]. Pt states she has tried chair exercises and was impressed by how difficult they were. Pt states she was sleeping 20 hours a day. Realized that is not getting her actively engaged in life so she wants to stop sleeping so much. Pt states she was Anxious and scared having stay on a specific plan and a lot of anxiety around food. Pt states she has  been working with her therapist using CBT; Working on saying "stop" during negative thoughts. Pt states tracking food gives her a lot of anxiety. Pt states the sodium in Gatorade zero makes her fel sluggish.   Pt received her graduate degree in education. Pt works as a Building surveyordaycare teacher. Pt lives alone but visits her parents often. Pt states she struggles with being the only one in her family who is obese, because her brothers and parents have a hard time relating to her and her weight. Pt has an extensive hx of dieting/trying weight loss programs through which she achieved weight loss but subsequent regain.  Pt states she feels protein helps her sleep. Pt states she does eat in the middle of the night. Pt states she is going to start to try to walk through her pain. Pt states 4 days a week she is doing PT and chair exercises.   Dietitian advised pt take advantage of this 6 months she has to wait for surgery due to Covid19 and continue to work on her relationship with food: Pt was agreeable to this.     NUTRITION ASSESSMENT  Anthropometrics  Start weight at [facility]: 456.4 lbs (date: 02/01/2018) Today's weight: 460 lbs Weight change: +1 lbs (since previous visit on 03/06/2018) BMI: 67.93 kg/m2    Clinical  Medical Hx: obesity, sleep apnea, asthma Medications: see list   24-Hr Dietary Recall: 3-4 meals a day First Meal:  poptart (then back to sleep) Snack: fruit Second Meal: grilled chicken cesear salad pack  Snack: cheese + crackers (hummus + carrots)  Third Meal: pasta with ground beef with veggies ground in it Snack: carrots and celery with hummus and popcorn  Beverages: water + coffee w/ Premier Protein + Crystal Light + gatorade zero   Food & Nutrition Related Hx Dietary Hx: Pt states she has been eating out a lot less frequently, only eating out dinner with her family on the weekends. Pt states she is continuing to improve on packing her lunch for work and making good food choices at work  when she does eat school lunch. Pt states she only drinks soda 1 to 2 times per week. Pt states she only drinks coffee twice per week, and she really enjoys it now (whereas before she just drank it for caffeine.) Pt states her family always eats pizza on Fridays, but recently they instead went to a sit down restaurant with healthy options when pt let her family know she was working on making dietary changes. Pt states she would like to incorporate more protein so she can meet her 60g/day goal, ideally adding more into breakfast/lunch. Pt states she feels better by eating breakfast, lunch, and a late dinner, and that making good food choices helps her feel good the next day.  Pt states she no longer drinks soda at fast food restaurants, but orders a large water instead. Her mom has been helping her meal prep soups, which keeps her from having to stop and pick up food on the way home. Pt has incorporated protein shakes into her day by adding in 2 shots of espresso for her breakfast in the mornings. She states this helps curb her appetite and wakes her up, since her pain medications cause her to feel groggy.  Estimated Daily Fluid Intake: 64+ oz Supplements: Women's One a Day MVI, vitamin D3 GI / Other Notable Symptoms: none stated   Physical Activity  Current average weekly physical activity: none recently, dt knee pain and COVID-19 causing her gym and apartment's fitness facility to close (previously would go to the gym once/week and hopes to eventually start swimming twice per week)  Estimated Energy Needs Calories: 1600 Carbohydrate: 180g Protein: 120g Fat: 44g   NUTRITION DIAGNOSIS  Overweight/obesity ([place]-3.3) related to past poor dietary habits and physical inactivity as evidenced by patient w/ planned RYGB surgery following dietary guidelines for continued weight loss.   NUTRITION INTERVENTION  Nutrition counseling (C-1) and education (E-2) to facilitate bariatric surgery goals.  Pre-Op  Goals Progress & New Goals Pt states she wants to make a list of non food activities to do for when about to emotionally eat and stay consistent with walking.   Learning Style & Readiness for Change Teaching method utilized: Visual & Auditory  Demonstrated degree of understanding via: Teach Back  Barriers to learning/adherence to lifestyle change: None Identified   RD's Notes for next Visit   This RD asked for pts permission to move pt to Mirian Capuchin RD schedule due to pts disordered eating and food anxiety. Pt was open to this idea and agreed. Pt understands it is uo to her whom she would like to work with.    MONITORING & EVALUATION Dietary intake, weekly physical activity, body weight, and pre-op goals in 1 month.   Next Steps  Patient is to return to [facility] in 1 month for 4th SWL.

## 2018-05-11 NOTE — Patient Instructions (Signed)
-  Talk to doctor about antibiotic causing pain possibly getting another one   -Try kefir   -

## 2018-05-18 ENCOUNTER — Telehealth: Payer: Self-pay | Admitting: Internal Medicine

## 2018-05-18 NOTE — Telephone Encounter (Signed)
I left a message on patient's voice mail to call back and r/s appointment.

## 2018-05-23 ENCOUNTER — Encounter: Payer: 59 | Admitting: Internal Medicine

## 2018-05-29 ENCOUNTER — Other Ambulatory Visit: Payer: Self-pay | Admitting: Internal Medicine

## 2018-06-01 ENCOUNTER — Ambulatory Visit: Payer: 59 | Admitting: Psychiatry

## 2018-06-06 ENCOUNTER — Encounter: Payer: 59 | Attending: General Surgery | Admitting: Registered"

## 2018-06-06 ENCOUNTER — Other Ambulatory Visit: Payer: Self-pay

## 2018-06-06 ENCOUNTER — Encounter: Payer: Self-pay | Admitting: Registered"

## 2018-06-06 DIAGNOSIS — E669 Obesity, unspecified: Secondary | ICD-10-CM | POA: Insufficient documentation

## 2018-06-06 NOTE — Patient Instructions (Addendum)
-   Check out Christy Harrison's podcast called Food Psyche.   - Challenge thoughts of right and wrong related to food. Listen to your body.

## 2018-06-06 NOTE — Progress Notes (Signed)
  Medical Nutrition Therapy:  Appt start time: 8:00 end time:  9:03.   Assessment:  Primary concerns today: Pt states she has always struggled with weight all of her life. Pt states she had some success with losing a lot of weight over the past 7 years doing medically supervised programs, low-calorie diets, and exercising twice a day. Pt states she was going to the gym 2x/day and eating well but did not like the environment because people were competitive, disrespectful, and made negative comments. Pt states she has issues with weight loss due to this experience.   Pt states it is family tradition to eat pizza on Fridays and she has been trying to change this and transition her family to eating healthier options. Pt states she tried making pizza with her family for the purpose of controlling her ingredients. Pt states she does not know how to handle others comments about food.  Pt states she meets with therapist virtually on a bi-weekly basis and has been seeing her since 2016. Loves her. Pt states she and therapist have vaguely talked about her relationship with food but it doesn't go anywhere. Open to working with another  therapist who specializes in disordered eating in addition to current therapist.  Pt states she feels that losing weight will free her of judgement of herself although she knows that that will not happen. States when she was a lesser weight she struggled with positive self-image thoughts. States dad made comment about her looking good when she was a lesser weight and how "we need to get you back to looking good again". Pt states she does not like sharing about weight loss because people treated her differently during the previous times she lost weight. Like she was a different person and didn't value her for who she was. Pt reports thinking about weight, food, doing things right or wrong, and her relationship with food a lot.   Pt states she was in a depressed mood over the weekend  and only ate once yesterday, [employer].    Preferred Learning Style:   No preference indicated   Learning Readiness:   Ready  Change in progress   MEDICATIONS: See list  Estimated energy needs: 1600-2000 calories 180-225 g carbohydrates 120-150 g protein 44-56 g fat  Progress Towards Goal(s):  In progress.   Nutritional Diagnosis:  NB-1.5 Disordered eating pattern As related to undue influence of weight on self-esteem.  As evidenced by pt reports losing weight will decrease self-judgement and reports lengthy period of time without eating, only eating 1 meal yesterday.    Intervention:  Nutrition education and counseling. Pt was educated and counseled on diet culture and research related to dieting. Discussed eating disorder therapist options.  Goals: - Check out Christy Harrison's podcast called Food Psyche.  - Challenge thoughts of right and wrong related to food. Listen to your body.   Teaching Method Utilized:  Visual Auditory Hands on  Handouts given during visit include:  none  Barriers to learning/adherence to lifestyle change: none identified  Demonstrated degree of understanding via:  Teach Back   Monitoring/Evaluation:  Dietary intake, exercise, and body weight in 2 week(s).

## 2018-06-09 ENCOUNTER — Encounter: Payer: Self-pay | Admitting: Internal Medicine

## 2018-06-09 ENCOUNTER — Other Ambulatory Visit: Payer: Self-pay

## 2018-06-09 ENCOUNTER — Ambulatory Visit (INDEPENDENT_AMBULATORY_CARE_PROVIDER_SITE_OTHER): Payer: 59 | Admitting: Internal Medicine

## 2018-06-09 VITALS — BP 110/84 | HR 75 | Temp 98.2°F | Ht 69.0 in | Wt >= 6400 oz

## 2018-06-09 DIAGNOSIS — Z0000 Encounter for general adult medical examination without abnormal findings: Secondary | ICD-10-CM | POA: Insufficient documentation

## 2018-06-09 DIAGNOSIS — F3341 Major depressive disorder, recurrent, in partial remission: Secondary | ICD-10-CM

## 2018-06-09 DIAGNOSIS — L723 Sebaceous cyst: Secondary | ICD-10-CM | POA: Diagnosis not present

## 2018-06-09 MED ORDER — CEPHALEXIN 500 MG PO CAPS: 500.0000 mg | ORAL_CAPSULE | Freq: Three times a day (TID) | ORAL | 2 refills | Status: DC

## 2018-06-09 NOTE — Progress Notes (Signed)
Subjective:    Patient ID: Robyn Jennings, female    DOB: 1980/10/18, 38 y.o.   MRN: [id number]  HPI Here for PE Is in evaluation for gastric bypass surgery  Furloughed due to COVID Financially difficult Family and unemployment have helped her get by  Has a plugged sweat gland Goes back ~5 years Between her breasts Flared up at the beginning of the pandemic Was hard and took a week to drain---finally "worked out the head" Got antibiotic from [facility]--couldn't tolerate (bactrim) Left a gaping hole in chest---cleaning it daily and slowly healed Still would get redness and drain since then  Not having regular periods for a few months Every other month now No sex recently--knows she isn't pregnant  Has had a flare of her depression with the COVID Working with her therapist about this Xanax is only very rarely needed  Concerned about possible dairy allergy Feels it flares her asthma (mostly milk and yogurt--cheese is okay) Will occasionally want milk and it will bloat her, etc lactaid is better but still causes itching and bloating Discussed almond or soy milk  Current Outpatient Medications on File Prior to Visit  Medication Sig Dispense Refill  . albuterol (PROVENTIL HFA;VENTOLIN HFA) 108 (90 Base) MCG/ACT inhaler Inhale 1 puff into the lungs 4 (four) times daily as needed. 18 g 3  . ALPRAZolam (XANAX) 0.25 MG tablet Take 0.25 mg by mouth 2 (two) times daily as needed for anxiety.    . cholecalciferol (VITAMIN D3) 25 MCG (1000 UT) tablet Take 1,000 Units by mouth daily.    Marland Kitchen EPINEPHrine (EPIPEN 2-PAK IJ) Inject as directed.    . escitalopram (LEXAPRO) 20 MG tablet Take 10 mg by mouth daily.     . fluticasone (FLONASE) 50 MCG/ACT nasal spray Place 2 sprays into both nostrils daily. In each nostril 16 g 12  . meloxicam (MOBIC) 15 MG tablet TAKE 1 TABLET (15 MG TOTAL) BY MOUTH DAILY AS NEEDED. 30 tablet 0  . montelukast (SINGULAIR) 10 MG tablet Take 1 tablet (10 mg total) by mouth  at bedtime. 90 tablet 3  . Multiple Vitamins-Minerals (WOMENS MULTIVITAMIN) TABS Take by mouth.    Marland Kitchen tiZANidine (ZANAFLEX) 4 MG tablet Take 1 tablet (4 mg total) by mouth every 6 (six) hours as needed for muscle spasms. 30 tablet 0   No current facility-administered medications on file prior to visit.     Allergies  Allergen Reactions  . Other Anaphylaxis    Nuts--All Types  . Bupropion   . Cetirizine   . Venlafaxine     Past Medical History:  Diagnosis Date  . Asthma   . Major depression in partial remission (HCC)   . Morbid obesity (HCC)   . Nasal polyposis   . Obstructive sleep apnea     Past Surgical History:  Procedure Laterality Date  . ANKLE SURGERY Left 2003   drilling for new collagen  . EYE SURGERY Bilateral    PRK  . NASAL POLYP EXCISION      Family History  Problem Relation Age of Onset  . Lupus Father   . Heart disease Father   . Diabetes Maternal Grandmother   . Heart disease Paternal Grandmother   . Lupus Brother   . Cancer Neg Hx     Social History   Socioeconomic History  . Marital status: Single    Spouse name: Not on file  . Number of children: 0  . Years of education: Not on file  . Highest  education level: Not on file  Occupational History  . Occupation: Lawyerubstitute teacher (past Chiropodistassistant director)    Comment: Day care setting  Social Needs  . Financial resource strain: Not on file  . Food insecurity:    Worry: Not on file    Inability: Not on file  . Transportation needs:    Medical: Not on file    Non-medical: Not on file  Tobacco Use  . Smoking status: Never Smoker  . Smokeless tobacco: Never Used  Substance and Sexual Activity  . Alcohol use: Not Currently  . Drug use: Never  . Sexual activity: Not on file  Lifestyle  . Physical activity:    Days per week: Not on file    Minutes per session: Not on file  . Stress: Not on file  Relationships  . Social connections:    Talks on phone: Not on file    Gets together: Not  on file    Attends religious service: Not on file    Active member of club or organization: Not on file    Attends meetings of clubs or organizations: Not on file    Relationship status: Not on file  . Intimate partner violence:    Fear of current or ex partner: Not on file    Emotionally abused: Not on file    Physically abused: Not on file    Forced sexual activity: Not on file  Other Topics Concern  . Not on file  Social History Narrative  . Not on file   Review of Systems  Constitutional: Negative for fatigue.       Wears seat belt  HENT: Negative for hearing loss and tinnitus.        Overdue for dentist  Respiratory: Negative for cough, chest tightness and shortness of breath.   Cardiovascular: Negative for chest pain, palpitations and leg swelling.  Gastrointestinal: Negative for constipation and diarrhea.       Occ heartburn--uses tums  Endocrine: Negative for polydipsia and polyuria.  Genitourinary: Negative for dysuria and hematuria.  Musculoskeletal: Positive for arthralgias and back pain. Negative for joint swelling.       Past left Baker's cyst Past injections in the left knee also  Skin: Negative for rash.  Allergic/Immunologic: Positive for environmental allergies. Negative for immunocompromised state.  Neurological: Negative for dizziness, syncope and light-headedness.       Occ AM headaches  Hematological: Negative for adenopathy. Does not bruise/bleed easily.  Psychiatric/Behavioral: Positive for dysphoric mood and sleep disturbance. The patient is not nervous/anxious.        Schedule off due to unemployment---doesn't feel her apnea is flaring       Objective:   Physical Exam  Constitutional: She appears well-developed. No distress.  HENT:  Head: Normocephalic and atraumatic.  Right Ear: External ear normal.  Left Ear: External ear normal.  Mouth/Throat: Oropharynx is clear and moist. No oropharyngeal exudate.  Eyes: Pupils are equal, round, and  reactive to light. Conjunctivae are normal.  Neck: No thyromegaly present.  Cardiovascular: Normal rate, regular rhythm, normal heart sounds and intact distal pulses. Exam reveals no gallop.  No murmur heard. Respiratory: Effort normal and breath sounds normal. No respiratory distress. She has no wheezes. She has no rales.  GI: Soft. There is no abdominal tenderness.  Musculoskeletal:        General: No tenderness or edema.  Lymphadenopathy:    She has no cervical adenopathy.  Skin:  Nodular lesion between breasts (apparently closed cyst  now) Dystrophic toenails #1-3 bilaterally  Psychiatric: She has a normal mood and affect. Her behavior is normal.           Assessment & Plan:

## 2018-06-09 NOTE — Assessment & Plan Note (Signed)
History clearly suggests recurrent cyst---currently not inflamed Will give Rx for cephalexin for prn use

## 2018-06-09 NOTE — Assessment & Plan Note (Signed)
Healthy but risks due to the obesity Discussed lifestyle Too young for cancer screening Recommended flu vaccine in fall--she will consider

## 2018-06-09 NOTE — Assessment & Plan Note (Signed)
Appropriate for gastric bypass Otherwise healthy and no medical contraindications for the procedure

## 2018-06-09 NOTE — Assessment & Plan Note (Signed)
Exacerbated by the pandemic Will continue the lexapro

## 2018-06-19 ENCOUNTER — Other Ambulatory Visit
Admission: RE | Admit: 2018-06-19 | Discharge: 2018-06-19 | Disposition: A | Discharge status: Home / Self Care | Payer: 59 | Source: Home / Self Care | Attending: General Surgery | Admitting: General Surgery

## 2018-06-19 ENCOUNTER — Other Ambulatory Visit: Payer: Self-pay

## 2018-06-19 ENCOUNTER — Ambulatory Visit
Admission: RE | Admit: 2018-06-19 | Discharge: 2018-06-19 | Disposition: A | Discharge status: Home / Self Care | Payer: 59 | Source: Ambulatory Visit | Attending: General Surgery | Admitting: General Surgery

## 2018-06-19 LAB — COMPREHENSIVE METABOLIC PANEL
ALT: 17 U/L (ref 0–44)
AST: 15 U/L (ref 15–41)
Albumin: 3.3 g/dL — ABNORMAL LOW (ref 3.5–5.0)
Alkaline Phosphatase: 58 U/L (ref 38–126)
Anion gap: 6 (ref 5–15)
BUN: 15 mg/dL (ref 6–20)
CO2: 27 mmol/L (ref 22–32)
Calcium: 8.7 mg/dL — ABNORMAL LOW (ref 8.9–10.3)
Chloride: 104 mmol/L (ref 98–111)
Creatinine, Ser: 0.77 mg/dL (ref 0.44–1.00)
GFR calc Af Amer: >60 mL/min (ref >60)
GFR calc non Af Amer: >60 mL/min (ref >60)
Glucose, Bld: 89 mg/dL (ref 70–99)
Potassium: 4.3 mmol/L (ref 3.5–5.1)
Sodium: 137 mmol/L (ref 135–145)
Total Bilirubin: 0.4 mg/dL (ref 0.3–1.2)
Total Protein: 7.6 g/dL (ref 6.5–8.1)

## 2018-06-19 LAB — CBC WITH DIFFERENTIAL/PLATELET
Abs Immature Granulocytes: 0.02 10*3/uL (ref 0.00–0.07)
Basophils Absolute: 0.1 10*3/uL (ref 0.0–0.1)
Basophils Relative: 1 %
Eosinophils Absolute: 0.3 10*3/uL (ref 0.0–0.5)
Eosinophils Relative: 4 %
HCT: 41.4 % (ref 36.0–46.0)
Hemoglobin: 13.1 g/dL (ref 12.0–15.0)
Immature Granulocytes: 0 %
Lymphocytes Relative: 26 %
Lymphs Abs: 1.8 10*3/uL (ref 0.7–4.0)
MCH: 28.8 pg (ref 26.0–34.0)
MCHC: 31.6 g/dL (ref 30.0–36.0)
MCV: 91 fL (ref 80.0–100.0)
Monocytes Absolute: 0.6 10*3/uL (ref 0.1–1.0)
Monocytes Relative: 8 %
Neutro Abs: 4.2 10*3/uL (ref 1.7–7.7)
Neutrophils Relative %: 61 %
Platelets: 320 10*3/uL (ref 150–400)
RBC: 4.55 MIL/uL (ref 3.87–5.11)
RDW: 13.6 % (ref 11.5–15.5)
WBC: 6.8 10*3/uL (ref 4.0–10.5)
nRBC: 0 % (ref 0.0–0.2)

## 2018-06-19 LAB — LIPID PANEL
Cholesterol: 167 mg/dL (ref 0–200)
HDL: 63 mg/dL (ref >40)
LDL Cholesterol: 96 mg/dL (ref 0–99)
Total CHOL/HDL Ratio: 2.7 RATIO
Triglycerides: 38 mg/dL (ref <150)
VLDL: 8 mg/dL (ref 0–40)

## 2018-06-19 LAB — HCG, QUANTITATIVE, PREGNANCY: hCG, Beta Chain, Quant, S: <1 m[IU]/mL (ref <5)

## 2018-06-19 LAB — VITAMIN B12: Vitamin B-12: 351 pg/mL (ref 180–914)

## 2018-06-19 LAB — IRON: Iron: 52 ug/dL (ref 28–170)

## 2018-06-19 LAB — TSH: TSH: 1.152 u[IU]/mL (ref 0.350–4.500)

## 2018-06-20 ENCOUNTER — Encounter: Payer: Self-pay | Admitting: Registered"

## 2018-06-20 ENCOUNTER — Encounter: Payer: 59 | Attending: General Surgery | Admitting: Registered"

## 2018-06-20 DIAGNOSIS — E669 Obesity, unspecified: Secondary | ICD-10-CM | POA: Insufficient documentation

## 2018-06-20 LAB — HEMOGLOBIN A1C
Hgb A1c MFr Bld: 5.2 % (ref 4.8–5.6)
Mean Plasma Glucose: 102.54 mg/dL

## 2018-06-20 LAB — H. PYLORI BREATH TEST: H. pylori UBiT: NEGATIVE

## 2018-06-20 LAB — CALCITRIOL (1,25 DI-OH VIT D): Vit D, 1,25-Dihydroxy: 45.2 pg/mL (ref 19.9–79.3)

## 2018-06-20 NOTE — Progress Notes (Signed)
  Medical Nutrition Therapy:  Appt start time: 10:30 end time:  12:00.   Assessment:  Primary concerns today:   Pt arrives stating 06/28 insurance will end. Planning to get COBRA insurance to stay in position to get bariatric surgery.   Pt states she recently adopted a dog; he loves walking with her. States he has helped keep her on a schedule, getting up during the day, and has given her sense of purpose lately.   Pt states she is having to manage expectations with family due to her expectations of them. Trying not to be overly sensitive and practicing compassion towards them. States this affects her relationship with food because she would emotionally eat after being upset by family. States she has replaced emotional eating with sleeping. Pt states she has 4 brothers (3 older and 1 younger).   Pt states she wants to be successful with bariatric surgery and understands that a very small percentage are successful.   Previous appts: Pt states she has always struggled with weight all of her life. Pt states she meets with therapist virtually on a bi-weekly basis and has been seeing her since 2016. Loves her. Pt states she and therapist have vaguely talked about her relationship with food but it doesn't go anywhere. Open to working with another  therapist who specializes in disordered eating in addition to current therapist.  Pt states she feels that losing weight will free her of judgement of herself although she knows that that will not happen. States when she was a lesser weight she struggled with positive self-image thoughts. States dad made comment about her looking good when she was a lesser weight and how "we need to get you back to looking good again". Pt states she does not like sharing about weight loss because people treated her differently during the previous times she lost weight. Like she was a different person and didn't value her for who she was. Pt reports thinking about weight, food,  doing things right or wrong, and her relationship with food a lot.    Preferred Learning Style:   No preference indicated   Learning Readiness:   Ready  Change in progress   MEDICATIONS: See list  Estimated energy needs: 1600-2000 calories 180-225 g carbohydrates 120-150 g protein 44-56 g fat  Progress Towards Goal(s):  In progress.   Nutritional Diagnosis:  NB-1.5 Disordered eating pattern As related to undue influence of weight on self-esteem.  As evidenced by pt reports losing weight will decrease self-judgement and reports lengthy period of time without eating, only eating 1 meal yesterday.    Intervention:  Nutrition education and counseling. Discussed bariatric surgery regimen, intuitive eating, what success looks like, having freedom with food, and emotional eating. Also discussed body positivity.   Teaching Method Utilized:  Visual Auditory Hands on  Handouts given during visit include:  none  Barriers to learning/adherence to lifestyle change: none identified  Demonstrated degree of understanding via:  Teach Back   Monitoring/Evaluation:  Dietary intake, exercise, and body weight in 2 week(s).

## 2018-06-23 LAB — VITAMIN B1: Vitamin B1 (Thiamine): 139 nmol/L (ref 66.5–200.0)

## 2018-06-27 ENCOUNTER — Ambulatory Visit (INDEPENDENT_AMBULATORY_CARE_PROVIDER_SITE_OTHER): Payer: 59 | Admitting: Psychology

## 2018-06-27 DIAGNOSIS — F509 Eating disorder, unspecified: Secondary | ICD-10-CM | POA: Diagnosis not present

## 2018-06-28 ENCOUNTER — Other Ambulatory Visit: Payer: Self-pay | Admitting: Internal Medicine

## 2018-07-04 ENCOUNTER — Encounter: Payer: 59 | Admitting: Registered"

## 2018-07-04 ENCOUNTER — Other Ambulatory Visit: Payer: Self-pay

## 2018-07-04 DIAGNOSIS — E669 Obesity, unspecified: Secondary | ICD-10-CM | POA: Diagnosis not present

## 2018-07-04 NOTE — Progress Notes (Signed)
  Medical Nutrition Therapy:  Appt start time: 10:06 end time: 11:12   Assessment:  Primary concerns today:   Referred to ED therapist by bariatric psychologist. Pt states she has been working on eating slower, chewing more, putting fork down while eating, and mindfully eating. Also states she has been listening to podcasts on intuitive eating and diet culture. Pt states eating is social and does not enjoy eating alone. States being single can be lonely. Likes to eat at parks with picnic blanket. States she is thinking a lot about why she wants to be in a smaller body. States she informed her family members of potential surgery. States she is still doing a lot of searching within herself.     Pt arrives stating insurance was extended for another month. States she may begin working in August.   Previous appts: Pt states she recently adopted a dog; he loves walking with her. States he has helped keep her on a schedule, getting up during the day, and has given her sense of purpose lately. Pt states she has 4 brothers (3 older and 1 younger).   Pt states she wants to be successful with bariatric surgery and understands that a very small percentage are successful. Pt states she has always struggled with weight all of her life. Pt states she meets with therapist virtually on a bi-weekly basis and has been seeing her since 2016. Loves her. Pt states she and therapist have vaguely talked about her relationship with food but it doesn't go anywhere. Open to working with another  therapist who specializes in disordered eating in addition to current therapist.  Pt states she feels that losing weight will free her of judgement of herself although she knows that that will not happen. States when she was a lesser weight she struggled with positive self-image thoughts. States dad made comment about her looking good when she was a lesser weight and how "we need to get you back to looking good again". Pt states she does  not like sharing about weight loss because people treated her differently during the previous times she lost weight. Like she was a different person and didn't value her for who she was. Pt reports thinking about weight, food, doing things right or wrong, and her relationship with food a lot.    Preferred Learning Style:   No preference indicated   Learning Readiness:   Ready  Change in progress   MEDICATIONS: See list  Estimated energy needs: 1600-2000 calories 180-225 g carbohydrates 120-150 g protein 44-56 g fat  Progress Towards Goal(s):  In progress.   Nutritional Diagnosis:  NB-1.5 Disordered eating pattern As related to undue influence of weight on self-esteem.  As evidenced by pt reports losing weight will decrease self-judgement and reports lengthy period of time without eating, only eating 1 meal yesterday.    Intervention:  Nutrition education and counseling. Discussed bariatric surgery, intuitive eating, having freedom with food, and emotional eating. Also discussed body positivity.  Goals: - Check out [email or web address]  and [email or web address]  on Instagram - Check out Intuitive Eating for the Culture podcast  Teaching Method Utilized:  Visual Auditory Hands on  Handouts given during visit include:  none  Barriers to learning/adherence to lifestyle change: none identified  Demonstrated degree of understanding via:  Teach Back   Monitoring/Evaluation:  Dietary intake, exercise, and body weight in 2 week(s).

## 2018-07-04 NOTE — Patient Instructions (Addendum)
-   Check out [email or web address]  and [email or web address]  on Instagram  - Check out Intuitive Eating for the Culture podcast

## 2018-07-11 ENCOUNTER — Ambulatory Visit: Payer: 59 | Admitting: Psychology

## 2018-07-12 ENCOUNTER — Ambulatory Visit (INDEPENDENT_AMBULATORY_CARE_PROVIDER_SITE_OTHER): Payer: 59 | Admitting: Psychology

## 2018-07-12 ENCOUNTER — Ambulatory Visit: Payer: 59 | Admitting: Psychology

## 2018-07-12 DIAGNOSIS — F509 Eating disorder, unspecified: Secondary | ICD-10-CM | POA: Diagnosis not present

## 2018-07-19 ENCOUNTER — Ambulatory Visit: Payer: 59 | Admitting: Registered"

## 2018-07-20 ENCOUNTER — Encounter: Payer: 59 | Attending: General Surgery | Admitting: Registered"

## 2018-07-20 ENCOUNTER — Encounter: Payer: Self-pay | Admitting: Registered"

## 2018-07-20 ENCOUNTER — Other Ambulatory Visit: Payer: Self-pay

## 2018-07-20 DIAGNOSIS — E669 Obesity, unspecified: Secondary | ICD-10-CM | POA: Insufficient documentation

## 2018-07-20 NOTE — Progress Notes (Signed)
Medical Nutrition Therapy:  Appt start time: 10:00 end time: 11:00   Assessment:  Primary concerns today:   Referred to ED therapist by bariatric psychologist.   Pt states she has started with a new therapist, Misty Humphries. Completed ROI today (07/20/18) for us to collaborate care.   Pt states she is exhausted with fighting against weight stigma and so many other things. States mobility is big for her and motivating her for surgery. States she has stopped seeing her last therapist. [facility] and pt states she can relate to some of but it has not been recurring.   Previous appts: States she is thinking a lot about why she wants to be in a smaller body. States she is still doing a lot of searching within herself. Pt arrives stating insurance was extended for another month. States she may begin working in August. Pt states she has been working on eating slower, chewing more, putting fork down while eating, and mindfully eating. Pt states she recently adopted a dog; he loves walking with her. States he has helped keep her on a schedule, getting up during the day, and has given her sense of purpose lately. Pt states she has 4 brothers (3 older and 1 younger).   Pt states she wants to be successful with bariatric surgery and understands that a very small percentage are successful. Pt states she has always struggled with weight all of her life. Pt states she meets with therapist virtually on a bi-weekly basis and has been seeing her since 2016. Loves her. Pt states she and therapist have vaguely talked about her relationship with food but it doesn't go anywhere. Open to working with another  therapist who specializes in disordered eating in addition to current therapist.  Pt states she feels that losing weight will free her of judgement of herself although she knows that that will not happen. States when she was a lesser weight she struggled with positive self-image  thoughts. States dad made comment about her looking good when she was a lesser weight and how "we need to get you back to looking good again". Pt states she does not like sharing about weight loss because people treated her differently during the previous times she lost weight. Like she was a different person and didn't value her for who she was. Pt reports thinking about weight, food, doing things right or wrong, and her relationship with food a lot.    Preferred Learning Style:   No preference indicated   Learning Readiness:   Ready  Change in progress   MEDICATIONS: See list  Estimated energy needs: 1600-2000 calories 180-225 g carbohydrates 120-150 g protein 44-56 g fat  Progress Towards Goal(s):  In progress.   Nutritional Diagnosis:  NB-1.5 Disordered eating pattern As related to undue influence of weight on self-esteem.  As evidenced by pt reports losing weight will decrease self-judgement and reports lengthy period of time without eating, only eating 1 meal yesterday.    Intervention:  Nutrition education and counseling. Discussed benefits of continuing with visits prior to surgery and importance of collaborating with therapist. Discussed goals for continuing to work with team-working on relationship with food and body positivity.   Teaching Method Utilized:  Visual Auditory Hands on  Handouts given during visit include:  none  Barriers to learning/adherence to lifestyle change: none identified  Demonstrated degree of understanding via:  Teach Back   Monitoring/Evaluation:  Dietary intake, exercise, and body weight in  2 week(s).

## 2018-07-26 ENCOUNTER — Other Ambulatory Visit (HOSPITAL_COMMUNITY): Payer: 59 | Attending: Psychiatry | Admitting: Psychiatry

## 2018-07-26 DIAGNOSIS — Z888 Allergy status to other drugs, medicaments and biological substances status: Secondary | ICD-10-CM | POA: Insufficient documentation

## 2018-07-26 DIAGNOSIS — Z8249 Family history of ischemic heart disease and other diseases of the circulatory system: Secondary | ICD-10-CM | POA: Insufficient documentation

## 2018-07-26 DIAGNOSIS — Z79899 Other long term (current) drug therapy: Secondary | ICD-10-CM | POA: Insufficient documentation

## 2018-07-26 DIAGNOSIS — F3341 Major depressive disorder, recurrent, in partial remission: Secondary | ICD-10-CM | POA: Insufficient documentation

## 2018-07-26 DIAGNOSIS — J45909 Unspecified asthma, uncomplicated: Secondary | ICD-10-CM | POA: Insufficient documentation

## 2018-07-26 DIAGNOSIS — G4733 Obstructive sleep apnea (adult) (pediatric): Secondary | ICD-10-CM | POA: Insufficient documentation
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
## 2018-07-27 ENCOUNTER — Other Ambulatory Visit: Payer: Self-pay

## 2018-07-27 ENCOUNTER — Ambulatory Visit (HOSPITAL_COMMUNITY): Payer: 59 | Admitting: Psychiatry

## 2018-07-27 ENCOUNTER — Encounter (HOSPITAL_COMMUNITY): Payer: Self-pay | Admitting: Psychiatry

## 2018-07-27 ENCOUNTER — Other Ambulatory Visit (HOSPITAL_COMMUNITY): Payer: 59 | Admitting: Psychiatry

## 2018-07-27 DIAGNOSIS — Z8249 Family history of ischemic heart disease and other diseases of the circulatory system: Secondary | ICD-10-CM | POA: Diagnosis not present

## 2018-07-27 DIAGNOSIS — G4733 Obstructive sleep apnea (adult) (pediatric): Secondary | ICD-10-CM | POA: Diagnosis not present

## 2018-07-27 DIAGNOSIS — J45909 Unspecified asthma, uncomplicated: Secondary | ICD-10-CM | POA: Diagnosis not present

## 2018-07-27 DIAGNOSIS — F3341 Major depressive disorder, recurrent, in partial remission: Secondary | ICD-10-CM

## 2018-07-27 DIAGNOSIS — Z888 Allergy status to other drugs, medicaments and biological substances status: Secondary | ICD-10-CM | POA: Diagnosis not present

## 2018-07-27 DIAGNOSIS — Z79899 Other long term (current) drug therapy: Secondary | ICD-10-CM | POA: Diagnosis not present

## 2018-07-27 NOTE — Progress Notes (Signed)
Virtual Visit via Video Note  I connected with Robyn Jennings on 07/27/18 at  9:00 AM EDT by a video enabled telemedicine application and verified that I am speaking with the correct person using two identifiers.  Location: Patient: Robyn Jennings Provider: Lise Auer, LCSW   I discussed the limitations of evaluation and management by telemedicine and the availability of in person appointments. The patient expressed understanding and agreed to proceed.  History of Present Illness: MDD   Observations/Objective: Case Manager checked in with all participants to review discharge dates, insurance authorizations, work-related documents and needs for the treatment team. Counselor engaged the group in getting to know the new group members and sharing about themselves. Counselor held a brief discussion on how COVID-19 restrictions have impacted group members social connections with other. Counselor introduced guest speaker, Jeanella Craze, Lead [facility], to facilitate a discussion around Grief and Loss topics. Participant shared personal grief and loss concerns and engaged in the conversation. Counselor prompted the group to journal about the G&L issues they would like to further process with their individual therapist. Counselor facilitated the creation of an [place] with group members. Participant shared about their ECOMAP and relationship dynamics they are currently experiencing. Counselor introduced Field seismologist, Jan Fireman, Yoga Instructor, to guide the group in a yoga practice. Counselor checked in with all participants to assess the benefits. Counselor closed by having each share one self-care act they would participate in today.   Assessment and Plan: Counselor recommends that Zurii remains in IOP treatment to better manage mental health symptoms and continue to address treatment plan goals. Counselor recommends adherence to crisis/safety plan, taking medications as prescribed and following up with  medical professionals if any issues arise.   Follow Up Instructions: Counselor will send Webex link for next session.    I discussed the assessment and treatment plan with the patient. The patient was provided an opportunity to ask questions and all were answered. The patient agreed with the plan and demonstrated an understanding of the instructions.   The patient was advised to call back or seek an in-person evaluation if the symptoms worsen or if the condition fails to improve as anticipated.  I provided 180 minutes of non-face-to-face time during this encounter.   Lise Auer, LCSW

## 2018-07-28 ENCOUNTER — Other Ambulatory Visit: Payer: Self-pay

## 2018-07-28 ENCOUNTER — Other Ambulatory Visit (HOSPITAL_BASED_OUTPATIENT_CLINIC_OR_DEPARTMENT_OTHER): Payer: 59 | Admitting: Licensed Clinical Social Worker

## 2018-07-28 ENCOUNTER — Encounter (HOSPITAL_COMMUNITY): Payer: Self-pay | Admitting: Family

## 2018-07-28 DIAGNOSIS — F3341 Major depressive disorder, recurrent, in partial remission: Secondary | ICD-10-CM

## 2018-07-28 NOTE — Progress Notes (Signed)
Virtual Visit via Telephone Note  I connected with Robyn Jennings Cortopassi on 07/31/18 at  9:00 AM EDT by telephone and verified that I am speaking with the correct person using two identifiers.   I discussed the limitations, risks, security and privacy concerns of performing an evaluation and management service by telephone and the availability of in person appointments. I also discussed with the patient that there may be a patient responsible charge related to this service. The patient expressed understanding and agreed to proceed.   I discussed the assessment and treatment plan with the patient. The patient was provided an opportunity to ask questions and all were answered. The patient agreed with the plan and demonstrated an understanding of the instructions.   The patient was advised to call back or seek an in-person evaluation if the symptoms worsen or if the condition fails to improve as anticipated.  I provided 30 minutes of non-face-to-face time during this encounter.   [name], NP    Psychiatric Initial Adult Assessment   Patient Identification: Robyn Jennings Malcolm MRN:  [id number] Date of Evaluation:  07/31/2018 Referral Source:  Self - referral  Chief Complaint:  Depression and Stress  Visit Diagnosis:    ICD-10-CM   1. Recurrent major depressive disorder, in partial remission (HCC)  F33.41     History of Present Illness: Robyn Jennings 38 year old female presented for worsening depression and anxiety as this relates to social isolation.  She reports she is followed by therapist [facility](Misty Humphreys) who is located in.  However she continues to have [employer] meetings every 2 weeks.  She denies suicidal or homicidal ideations.  Denies auditory or visual hallucinations.  Reports current stressors related to being furloughed since COVID started.  She reports she was hopeful to be approved for weight loss surgery however her health insurance has now been question due to unemployment status.  She reported she  has intended previous intensive partial hospitalization programming (2017) reports she is followed by her primary care [name](Richard Sethbland)  provider who writes her for Lexapro 20 mg and Xanax 0.25mg  where she reports taking medications as needed.  Patient to start intensive outpatient programming (IOP) on 07/26/2018   Teresha denies previous inpatient admissions or family history related to mental illness.  Reports passive suicidal ideation however denies intent or plan.  Patient reports stress related to being furloughed and possible loss of insurance.  Reported financial after [place] roughly about 8 months prior.  Reports she is currently followed by a therapist and a psychiatrist.  She reports eating disorder.  Denies history of physical or sexual abuse in the past patient does report emotional abuse as she states she was bullied as a child due to her size.  Support encouragement reassurance was provided.   Associated Signs/Symptoms: Depression Symptoms:  depressed mood, feelings of worthlessness/guilt, difficulty concentrating, anxiety, (Hypo) Manic Symptoms:  Distractibility, Anxiety Symptoms:  Excessive Worry, Social Anxiety, Psychotic Symptoms:  Hallucinations: None PTSD Symptoms: NA  Past Psychiatric History: Major depression.  [facility]rey-therapist.  Previous intensive outpatient programming 2017  Previous Psychotropic Medications: Yes   Substance Abuse History in the last 12 months:  No.  Consequences of Substance Abuse: Negative  Past Medical History:  Past Medical History:  Diagnosis Date  . Asthma   . Major depression in partial remission (HCC)   . Morbid obesity (HCC)   . Nasal polyposis   . Obstructive sleep apnea     Past  Surgical History:  Procedure Laterality Date  . ANKLE SURGERY Left 2003   drilling for new collagen  . EYE SURGERY Bilateral    PRK  . NASAL POLYP EXCISION       Family Psychiatric History:   Family History:  Family History  Problem Relation Age of Onset  . Lupus Father   . Heart disease Father   . Diabetes Maternal Grandmother   . Heart disease Paternal Grandmother   . Lupus Brother   . Cancer Neg Hx     Social History:   Social History   Socioeconomic History  . Marital status: Single    Spouse name: Not on file  . Number of children: 0  . Years of education: Not on file  . Highest education level: Not on file  Occupational History  . Occupation: Oceanographer (past Surveyor, quantity)    Comment: Day care setting  Social Needs  . Financial resource strain: Not on file  . Food insecurity    Worry: Not on file    Inability: Not on file  . Transportation needs    Medical: Not on file    Non-medical: Not on file  Tobacco Use  . Smoking status: Never Smoker  . Smokeless tobacco: Never Used  Substance and Sexual Activity  . Alcohol use: Not Currently  . Drug use: Never  . Sexual activity: Not on file  Lifestyle  . Physical activity    Days per week: Not on file    Minutes per session: Not on file  . Stress: Not on file  Relationships  . Social Herbalist on phone: Not on file    Gets together: Not on file    Attends religious service: Not on file    Active member of club or organization: Not on file    Attends meetings of clubs or organizations: Not on file    Relationship status: Not on file  Other Topics Concern  . Not on file  Social History Narrative  . Not on file    Additional Social History:   Allergies:   Allergies  Allergen Reactions  . Other Anaphylaxis    Nuts--All Types  . Bupropion   . Cetirizine   . Venlafaxine     Metabolic Disorder Labs: Lab Results  Component Value Date   HGBA1C 5.2 06/19/2018   MPG 102.54 06/19/2018   No results found for: PROLACTIN Lab Results  Component Value Date   CHOL 167 06/19/2018   TRIG 38 06/19/2018   HDL 63 06/19/2018   CHOLHDL 2.7  06/19/2018   VLDL 8 06/19/2018   LDLCALC 96 06/19/2018   Lab Results  Component Value Date   TSH 1.152 06/19/2018    Therapeutic Level Labs: No results found for: LITHIUM No results found for: CBMZ No results found for: VALPROATE  Current Medications: Current Outpatient Medications  Medication Sig Dispense Refill  . albuterol (PROVENTIL HFA;VENTOLIN HFA) 108 (90 Base) MCG/ACT inhaler Inhale 1 puff into the lungs 4 (four) times daily as needed. 18 g 3  . ALPRAZolam (XANAX) 0.25 MG tablet Take 0.25 mg by mouth 2 (two) times daily as needed for anxiety.    . cephALEXin (KEFLEX) 500 MG capsule Take 1 capsule (500 mg total) by mouth 3 (three) times daily. For infected cyst 15 capsule 2  . cholecalciferol (VITAMIN D3) 25 MCG (1000 UT) tablet Take 1,000 Units by mouth daily.    Marland Kitchen EPINEPHrine (EPIPEN 2-PAK IJ) Inject as directed.    Marland Kitchen  escitalopram (LEXAPRO) 20 MG tablet TAKE 1/2 TABLET BY MOUTH ONCE PER DAY. 45 tablet 3  . fluticasone (FLONASE) 50 MCG/ACT nasal spray Place 2 sprays into both nostrils daily. In each nostril 16 g 12  . meloxicam (MOBIC) 15 MG tablet TAKE 1 TABLET (15 MG TOTAL) BY MOUTH DAILY AS NEEDED. 30 tablet 0  . montelukast (SINGULAIR) 10 MG tablet Take 1 tablet (10 mg total) by mouth at bedtime. 90 tablet 3  . Multiple Vitamins-Minerals (WOMENS MULTIVITAMIN) TABS Take by mouth.    Marland Kitchen. tiZANidine (ZANAFLEX) 4 MG tablet Take 1 tablet (4 mg total) by mouth every 6 (six) hours as needed for muscle spasms. 30 tablet 0   No current facility-administered medications for this visit.     Musculoskeletal:   Psychiatric Specialty Exam: ROS  There were no vitals taken for this visit.There is no height or weight on file to calculate BMI.  General Appearance: NA  Eye Contact:  NA  Speech:  Clear and Coherent  Volume:  Normal  Mood:  Anxious and Depressed  Affect:  Congruent  Thought Process:  Coherent  Orientation:  Full (Time, Place, and Person)  Thought Content:  Logical   Suicidal Thoughts:  No  Homicidal Thoughts:  No  Memory:  Immediate;   Fair Recent;   Fair  Judgement:  Fair  Insight:  Fair  Psychomotor Activity:  Normal  Concentration:  Concentration: Fair  Recall:  FiservFair  Fund of Knowledge:Fair  Language: Fair  Akathisia:  No  Handed:  Right  AIMS (if indicated):  Assets:  Communication Skills Desire for Improvement Resilience Social Support  ADL's:  Intact  Cognition: WNL  Sleep:  Fair   Screenings: PHQ2-9     Nutrition from 02/01/2018 in [facility]  PHQ-2 Total Score  0      Assessment and Plan:  Admitted to Intensive outpatient programming Continue Lexapro 20 mg daily Continue Xanax 0.5 mg as needed daily as prescribed by primary care provider  Treatment plan was reviewed and agreed upon by NPT Jamil Castillo and patient Robyn Jennings need for group services   Oneta Rackanika N Hien Perreira, NP 7/20/20208:15 AM

## 2018-07-31 ENCOUNTER — Other Ambulatory Visit (HOSPITAL_COMMUNITY): Payer: 59 | Admitting: Psychiatry

## 2018-07-31 ENCOUNTER — Other Ambulatory Visit: Payer: Self-pay

## 2018-07-31 DIAGNOSIS — F3341 Major depressive disorder, recurrent, in partial remission: Secondary | ICD-10-CM | POA: Diagnosis not present

## 2018-07-31 NOTE — Progress Notes (Signed)
Virtual Visit via Video Note  I connected with Robyn Jennings on 07/31/18 at  9:00 AM EDT by a video enabled telemedicine application and verified that I am speaking with the correct person using two identifiers.  Location: Patient: Robyn Jennings Provider: Lise Auer, LCSW   I discussed the limitations of evaluation and management by telemedicine and the availability of in person appointments. The patient expressed understanding and agreed to proceed.  History of Present Illness: MDD   Observations/Objective: Case Manager checked in with all participants to review discharge dates, insurance authorizations, work-related documents and needs for the treatment team. Counselor checked in with all group members to assess how their weekends were and to introduce everyone to assess how they are applying coping skills. Counselor provided psychoeducation on the ACEs Study, administered the ACEs Assessment Tool, and discussed the results with all group members. Counselor allowed time for group members to share their connections with childhood trauma and how it is currently impacting them today. Counselor provided information on two worksheets about family dynamics and impact on interpersonal relationships of Adult Children of an Alcoholic and processed the roles and beliefs they most connected with. Counselor ended the session by discussing the concept of resiliency, administering the resiliency assessment tool and processing the results with everyone. Counselor wrapped up group and had everyone share a self-care or productivity task can plan for today. Robyn Jennings shared that she has already noticed that group has had a positive impact on her overall mood. She participated well in services today, but has some tech issues, causing her to be dropped from the video chat from time to time. She was able to relate to the material and identify ways she is resilient.   Assessment and Plan: Counselor recommends that Robyn Jennings  remains in IOP treatment to better manage mental health symptoms and continue to address treatment plan goals. Counselor recommends adherence to crisis/safety plan, taking medications as prescribed and following up with medical professionals if any issues arise.   Follow Up Instructions: Counselor will send Webex link for next session.    I discussed the assessment and treatment plan with the patient. The patient was provided an opportunity to ask questions and all were answered. The patient agreed with the plan and demonstrated an understanding of the instructions.   The patient was advised to call back or seek an in-person evaluation if the symptoms worsen or if the condition fails to improve as anticipated.  I provided 180 minutes of non-face-to-face time during this encounter.   Lise Auer, LCSW

## 2018-08-01 ENCOUNTER — Telehealth (HOSPITAL_COMMUNITY): Payer: Self-pay | Admitting: Psychiatry

## 2018-08-01 ENCOUNTER — Other Ambulatory Visit: Payer: Self-pay

## 2018-08-01 ENCOUNTER — Other Ambulatory Visit (HOSPITAL_COMMUNITY): Payer: 59 | Admitting: Psychiatry

## 2018-08-02 ENCOUNTER — Other Ambulatory Visit (HOSPITAL_COMMUNITY): Payer: 59 | Admitting: Psychiatry

## 2018-08-02 ENCOUNTER — Other Ambulatory Visit: Payer: Self-pay

## 2018-08-02 DIAGNOSIS — F3341 Major depressive disorder, recurrent, in partial remission: Secondary | ICD-10-CM

## 2018-08-03 ENCOUNTER — Other Ambulatory Visit: Payer: Self-pay

## 2018-08-03 ENCOUNTER — Encounter (HOSPITAL_COMMUNITY): Payer: Self-pay | Admitting: Psychiatry

## 2018-08-03 ENCOUNTER — Encounter: Payer: 59 | Admitting: Registered"

## 2018-08-03 ENCOUNTER — Encounter: Payer: Self-pay | Admitting: Registered"

## 2018-08-03 DIAGNOSIS — E669 Obesity, unspecified: Secondary | ICD-10-CM

## 2018-08-03 NOTE — Progress Notes (Signed)
Virtual Visit via Video Note  I connected with Robyn Jennings on 08/03/18 at  9:00 AM EDT by a video enabled telemedicine application and verified that I am speaking with the correct person using two identifiers.  Location: Patient: Robyn Jennings Provider: Lise Auer, LCSW   I discussed the limitations of evaluation and management by telemedicine and the availability of in person appointments. The patient expressed understanding and agreed to proceed.  History of Present Illness: Recurrent MDD   Observations/Objective: Case Manager checked in with all participants to review discharge dates, insurance authorizations, work-related documents and needs for the treatment team. Counselor engaged the group by asking them to share about the most beautiful place they have ever visited. Group members each shared and Counselor encouraged to use guided imagery as a coping skill to escape to their "happy places". Counselor opened up for the group to share resources for mental health management. Group members shared links, apps, locations, [employer], etc. Counselor shared an exhaustive list with the group on local, state and national mental health and substance abuse resources. After break the group worked on their safety and crisis plans. Counselor encouraged group members to share their plans with their support system and mental health providers for easy access in case of an emergency. Counselor noted progress of individuals in the group. Counselor closed by having group members share a self-care task and productivity activity they will do today.  Robyn Jennings engaged well in discussions and activities today. She reported feeling happier and more equipped to deal with life's stressors. She completed the safety plan and will share it with her family members to prevent future mental health episodes.   Assessment and Plan: Counselor recommends that Torra remains in IOP treatment to better manage mental health  symptoms and continue to address treatment plan goals. Counselor recommends adherence to crisis/safety plan, taking medications as prescribed and following up with medical professionals if any issues arise.   Follow Up Instructions: Counselor will send Webex link for next session.    I discussed the assessment and treatment plan with the patient. The patient was provided an opportunity to ask questions and all were answered. The patient agreed with the plan and demonstrated an understanding of the instructions.   The patient was advised to call back or seek an in-person evaluation if the symptoms worsen or if the condition fails to improve as anticipated.  I provided 180 minutes of non-face-to-face time during this encounter.

## 2018-08-03 NOTE — Progress Notes (Signed)
Medical Nutrition Therapy:  Appt start time: 11:00 end time: 12:00   Assessment:  Primary concerns today:   Referred to ED therapist by bariatric psychologist.   Pt arrives stating she is no longer taking Meloxicam, knee is manageable. States is currently in virtual IOP for depression and support. Unsure if she will get re-established with previous therapist once IOP has ended.   Reports lately she has been eating out once a day. States she notices she has days when she eats "carb-heavy or protein-heavy" and wants to find balance. States a long time passed between lunch and dinner yesterday due to being on-the-go and not wanting to stop and eat out again. States she was still hungry before going to bed but didn't eat because she was so tired. Woke up this morning extremely hungry.   Next visit: right combination of food/model of meals  Previous appts: Pt states she has started with a new therapist, [employer]. Completed ROI today (07/20/18) for [place] to collaborate care. Pt states she is exhausted with fighting against weight stigma and so many other things. States mobility is big for her and motivating her for surgery. States she has stopped seeing her last therapist. We went through [facility] for BED diagnosis and pt states she can relate to some of but it has not been recurring. States she is thinking a lot about why she wants to be in a smaller body. States she is still doing a lot of searching within herself. Pt arrives stating insurance was extended for another month. States she may begin working in August. Pt states she has been working on eating slower, chewing more, putting fork down while eating, and mindfully eating. Pt states she recently adopted a dog; he loves walking with her. States he has helped keep her on a schedule, getting up during the day, and has given her sense of purpose lately. Pt states she has 4 brothers (3 older and 1 younger).   Pt states she wants to be successful with  bariatric surgery and understands that a very small percentage are successful. Pt states she has always struggled with weight all of her life. Pt states she meets with therapist virtually on a bi-weekly basis and has been seeing her since 2016. Loves her. Pt states she and therapist have vaguely talked about her relationship with food but it doesn't go anywhere. Open to working with another  therapist who specializes in disordered eating in addition to current therapist.  Pt states she feels that losing weight will free her of judgement of herself although she knows that that will not happen. States when she was a lesser weight she struggled with positive self-image thoughts. States dad made comment about her looking good when she was a lesser weight and how "we need to get you back to looking good again". Pt states she does not like sharing about weight loss because people treated her differently during the previous times she lost weight. Like she was a different person and didn't value her for who she was. Pt reports thinking about weight, food, doing things right or wrong, and her relationship with food a lot.   24 hour recall: B: instant oatmeal + 2 slices of toast or eggs + bacon/sausage or omelette  L: [employer] + small fry + small Dr. Malachi Bonds S: 1/2 mango + cookies  D: instant oatmeal + 2 slices of toast or meat + vegetable  Beverages: Dr. Malachi Bonds, water (~64 oz), crystal light  Physical activity:  walks 2 mi, leisure swimming  Preferred Learning Style:   No preference indicated   Learning Readiness:   Ready  Change in progress   MEDICATIONS: See list  Estimated energy needs: 1600-2000 calories 180-225 g carbohydrates 120-150 g protein 44-56 g fat  Progress Towards Goal(s):  In progress.   Nutritional Diagnosis:  NB-1.5 Disordered eating pattern As related to undue influence of weight on self-esteem.  As evidenced by pt reports losing weight will decrease  self-judgement and reports lengthy period of time without eating, only eating 1 meal yesterday.    Intervention:  Nutrition education and counseling. Discussed importance of staying well-nourished and hydrated while being on-the-go; counseled on ways to do that. Discussed importance of being connected to bariatric support groups and social media outlets sponsored by her bariatric program.  Goals: - Aim to have snacks on hand especially when running errands and on-the-go. Items such as peanut butter crackers, cheese and crackers, fruit, string cheese, etc.  - Keep water handy as well.   Teaching Method Utilized:  Visual Auditory Hands on  Handouts given during visit include:  none  Barriers to learning/adherence to lifestyle change: none identified  Demonstrated degree of understanding via:  Teach Back   Monitoring/Evaluation:  Dietary intake, exercise, and body weight in 2 week(s).

## 2018-08-03 NOTE — Patient Instructions (Addendum)
-   Aim to have snacks on hand especially when running errands and on-the-go. Items such as peanut butter crackers, cheese and crackers, fruit, string cheese, etc.   - Keep water handy as well.

## 2018-08-04 ENCOUNTER — Telehealth (HOSPITAL_COMMUNITY): Payer: Self-pay | Admitting: Psychiatry

## 2018-08-04 ENCOUNTER — Other Ambulatory Visit (HOSPITAL_COMMUNITY): Payer: 59 | Admitting: Psychiatry

## 2018-08-07 ENCOUNTER — Other Ambulatory Visit (HOSPITAL_BASED_OUTPATIENT_CLINIC_OR_DEPARTMENT_OTHER): Payer: 59 | Admitting: Licensed Clinical Social Worker

## 2018-08-07 ENCOUNTER — Other Ambulatory Visit: Payer: Self-pay

## 2018-08-07 DIAGNOSIS — F3341 Major depressive disorder, recurrent, in partial remission: Secondary | ICD-10-CM

## 2018-08-08 ENCOUNTER — Other Ambulatory Visit: Payer: Self-pay

## 2018-08-08 ENCOUNTER — Other Ambulatory Visit (HOSPITAL_COMMUNITY): Payer: 59 | Admitting: Licensed Clinical Social Worker

## 2018-08-08 DIAGNOSIS — F3341 Major depressive disorder, recurrent, in partial remission: Secondary | ICD-10-CM | POA: Diagnosis not present

## 2018-08-08 NOTE — Progress Notes (Signed)
  Virtual Visit via Video Note  I connected with Robyn Jennings on 07/28/18 at  9:00 AM EDT by a video enabled telemedicine application and verified that I am speaking with the correct person using two identifiers.   I discussed the limitations of evaluation and management by telemedicine and the availability of in person appointments. The patient expressed understanding and agreed to proceed.  I discussed the assessment and treatment plan with the patient. The patient was provided an opportunity to ask questions and all were answered. The patient agreed with the plan and demonstrated an understanding of the instructions.   The patient was advised to call back or seek an in-person evaluation if the symptoms worsen or if the condition fails to improve as anticipated.  I provided 180 minutes of non-face-to-face time during this encounter.   Olegario Messier, LCSW    Daily Group Progress Note  Program: IOP  Group Time: 9am-12pm  Participation Level: Active  Behavioral Response: Appropriate  Type of Therapy:  Group Therapy; process group, psycho-educational group  Summary of Progress:  Clinician met with clients assessing for SI/HI/psychosis and overall level of functioning. Clinician inquired about recent stressors and skills used to address Clinician and group members practiced having moments of vulnerability and using distress tolerance skills to discuss loaded questions with topics including judgement and criticism of self from others.   Clinician provided psycho-educational information on types of coping skills and encouraged group members to become creative with personalized examples. Clinician reviewed the TIPP skill to address overwhelming emotions. Clinician and group members practiced box breathing and 5-7-9. Clinician encouraged client to practice a skill daily to gain mastery for use in difficult situations.  Client engaged in group discussions. Client shows progress toward goal  by showing openness to utilizing TIPP skill at least once in the following week to see effectiveness.  Olegario Messier, LCSW

## 2018-08-09 ENCOUNTER — Telehealth (HOSPITAL_COMMUNITY): Payer: Self-pay | Admitting: Psychiatry

## 2018-08-09 ENCOUNTER — Other Ambulatory Visit: Payer: Self-pay

## 2018-08-09 ENCOUNTER — Other Ambulatory Visit (HOSPITAL_COMMUNITY): Payer: 59 | Admitting: Psychiatry

## 2018-08-09 NOTE — Telephone Encounter (Signed)
Robyn Jennings, Can you see if you can get any update on the status of her planned bariatric procedure?

## 2018-08-10 ENCOUNTER — Other Ambulatory Visit: Payer: Self-pay

## 2018-08-10 ENCOUNTER — Other Ambulatory Visit (HOSPITAL_BASED_OUTPATIENT_CLINIC_OR_DEPARTMENT_OTHER): Payer: 59 | Admitting: Licensed Clinical Social Worker

## 2018-08-10 DIAGNOSIS — F3341 Major depressive disorder, recurrent, in partial remission: Secondary | ICD-10-CM

## 2018-08-11 ENCOUNTER — Other Ambulatory Visit (HOSPITAL_COMMUNITY): Payer: 59 | Admitting: Licensed Clinical Social Worker

## 2018-08-11 ENCOUNTER — Other Ambulatory Visit: Payer: Self-pay

## 2018-08-11 DIAGNOSIS — F3341 Major depressive disorder, recurrent, in partial remission: Secondary | ICD-10-CM

## 2018-08-11 NOTE — Patient Instructions (Signed)
D:  Patient will complete MH-IOP on 08-14-18.  A:  Discharge on 08-14-18.  Follow up with Dr. Adele Schilder on 08-18-18 @ 11 a.m.  Pt will schedule appt with Sheila Oats, LPC.  Encouraged support groups.  R:  Pt receptive.

## 2018-08-14 ENCOUNTER — Other Ambulatory Visit (HOSPITAL_COMMUNITY): Payer: 59 | Attending: Psychiatry | Admitting: Psychiatry

## 2018-08-14 ENCOUNTER — Other Ambulatory Visit: Payer: Self-pay

## 2018-08-14 ENCOUNTER — Encounter (HOSPITAL_COMMUNITY): Payer: Self-pay | Admitting: Family

## 2018-08-14 DIAGNOSIS — F3341 Major depressive disorder, recurrent, in partial remission: Secondary | ICD-10-CM | POA: Diagnosis not present

## 2018-08-14 NOTE — Progress Notes (Signed)
Virtual Visit via Telephone Note  I connected with Robyn Jennings on 08/15/18 at  9:00 AM EDT by telephone and verified that I am speaking with the correct person using two identifiers.   I discussed the limitations, risks, security and privacy concerns of performing an evaluation and management service by telephone and the availability of in person appointments. I also discussed with the patient that there may be a patient responsible charge related to this service. The patient expressed understanding and agreed to proceed.  I discussed the assessment and treatment plan with the patient. The patient was provided an opportunity to ask questions and all were answered. The patient agreed with the plan and demonstrated an understanding of the instructions.   The patient was advised to call back or seek an in-person evaluation if the symptoms worsen or if the condition fails to improve as anticipated.  I provided 00 minutes of non-face-to-face time during this encounter.   Derrill Center, NP   [facility] Intensive Outpatient Program Discharge Summary  Robyn Jennings [id number]  Admission date: 07/28/2018 Discharge date: 08/14/2018  Reason for admission: Per assessment note: Robyn Jennings 38 year old female presented for worsening depression and anxiety as this relates to social isolation.  She reports she is followed by therapist Robyn Jennings) who is located in.  However she continues to have [employer] every 2 weeks.  She denies suicidal or homicidal ideations.  Denies auditory or visual hallucinations.  Reports current stressors related to being furloughed since COVID started.  She reports she was hopeful to be approved for weight loss surgery however her health insurance has now been question due to unemployment status.  She reported she has intended previous intensive partial hospitalization programming (2017) reports she is followed by her primary care Robyn Jennings)  provider  who writes her for Lexapro 20 mg and Xanax 0.25mg  where she reports taking medications as needed.  Patient to start intensive outpatient programming (IOP) on 07/26/2018   Family of Origin Issues: Denied  Progress in Program Toward Treatment Goals: Ongoing patient attended and participated with daily group session with active and engaged participation.  Patient reports her mood has improved since attending intensive outpatient programming.  Reports plans to follow-up with her therapist.  Denies suicidal or homicidal ideations.  Denies auditory or visual hallucinations.  Progress (rationale): Patient to keep follow-up with Robyn Jennings on 08/18/2018 at 11 AM and therapist Robyn Jennings [facility]  Take all medications as prescribed. Keep all follow-up appointments as scheduled.  Do not consume alcohol or use illegal drugs while on prescription medications. Report any adverse effects from your medications to your primary care provider promptly.  In the event of recurrent symptoms or worsening symptoms, call 911, a crisis hotline, or go to the nearest emergency department for evaluation.   Derrill Center, NP 08/14/2018

## 2018-08-15 ENCOUNTER — Other Ambulatory Visit (HOSPITAL_COMMUNITY): Payer: 59 | Admitting: Psychiatry

## 2018-08-15 ENCOUNTER — Other Ambulatory Visit: Payer: Self-pay

## 2018-08-15 NOTE — Progress Notes (Signed)
Virtual Visit via Video Note  I connected with Robyn Jennings on 08/15/18 at 1200 by a video enabled telemedicine application and verified that I am speaking with the correct person using two identifiers.  I discussed the limitations of evaluation and management by telemedicine and the availability of in person appointments. The patient expressed understanding and agreed to proceed. I discussed the assessment and treatment plan with the patient. The patient was provided an opportunity to ask questions and all were answered. The patient agreed with the plan and demonstrated an understanding of the instructions.   The patient was advised to call back or seek an in-person evaluation if the symptoms worsen or if the condition fails to improve as anticipated.  I provided 20 minutes of non-face-to-face time during this encounter.   As previous CCA states: This is a 38 yr old, single, employed, [place] American female who was a self-referral; treatment for worsening depressive sx's with passive SI (no plan or intent).  Denies any prior attempts.  According to pt, her sx's started worsening ~ two weeks ago; but she became very worried this past weekend.  "I got worried because I was even more tearful, sad and was having the vague SI thoughts.  I was at my parents home and it was a stressful visit, but no more than ususal."  Stressors/Triggers:  1)  Preparing for weight loss surgery, but it's been delayed by the doctor.  "Apparently, I said something during my assessment which alerted him that I have an eating d/o.  This is far from the truth."  According to pt, she has been referred to an eating d/o therapist now.  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  Statres she out until 08-21-18.  3)  Move from [place] in April 2019.  States a lot of her friends are still in [place].  Pt's parents moved here 8 yrs ago after they retired.  Pt admits to one previous psych inpt admit at  [facility] (Oct. 30-Nov. 6, 2017) d/t depression with SI.  Denies seeing a psychiatrist; but has seen [employer] (Eating D/O therapist) for  1 1/2 weeks.  States she continues to tele-psych with her psychologist in [place] every two weeks.  Hx of attending MH-IOP in [place] (2017).  Family hx:  Denies. Patients Currently Reported Symptoms/Problems: Sadness, Poor sleep (awakenings), admits to napping during the day, poor appetite, decreased concentration, isolative, tearful, no energy, anhedonia  Patient attended MH-IOP from 07-26-18 thru 08-14-18.  Reports that the groups have been very helpful.  "It's been helpful learning new skills, connecting with others and being able to talk freely."  Pt states she is interested in family support groups.  Reports not struggling with any symptoms.  Denies SI/HI or A/V hallucinations.  A: D/C yesterday.  Strongly recommend support groups thru [facility].  F/U with Dr. Adele Schilder on 08-18-18 @ 11 am and pt will contact Sheila Oats, [facility] for f/u appt.  R:  Pt receptive.  Dellia Nims, M.Ed,CNA

## 2018-08-16 ENCOUNTER — Ambulatory Visit (HOSPITAL_COMMUNITY): Payer: 59

## 2018-08-17 ENCOUNTER — Ambulatory Visit: Payer: 59 | Admitting: Registered"

## 2018-08-17 NOTE — Progress Notes (Signed)
Virtual Visit via Video Note  I connected with Robyn Jennings on 08/11/2018  at  9:00 AM EDT by a video enabled telemedicine application and verified that I am speaking with the correct person using two identifiers.   I discussed the limitations of evaluation and management by telemedicine and the availability of in person appointments. The patient expressed understanding and agreed to proceed.  I discussed the assessment and treatment plan with the patient. The patient was provided an opportunity to ask questions and all were answered. The patient agreed with the plan and demonstrated an understanding of the instructions.   The patient was advised to call back or seek an in-person evaluation if the symptoms worsen or if the condition fails to improve as anticipated.  I provided 139minutes of non-face-to-face time during this encounter.   Olegario Messier, LCSW     Daily Group Progress Note  Program: IOP   Group Time: 9am-12pm   Participation Level: Active   Behavioral Response: Appropriate, Sharing and Motivated   Type of Therapy:  Group Therapy; process group, psycho-educational group   Summary of Progress:  Clinician checked in with group members, assessing for SI/HI/psychosis and overall level of functioning. Clinician and group members discussed highs and lows of the previous day and any skills practiced. Clinician and group members practiced vulnerability and assertive communication with loaded questions. Clinician and group members processed coping with irrational behaviors and shutting people out. Clinician presented STOPP skill, focusing on managing overwhelming feelings and being able to effectively make appropriate decisions despite coping with uncomfortable emotions.   Psycho-educational session of group co-facilitated by Doctor, general practice. Education focused on sleep hygiene, healthy eating habits, and movement to improve overall health and mental health.  Client engaged in  group discussions and activities. Client is able to identify shutting people out and identifies prevoius experiences learning to setting strict boundaries. Client identifies anxiety causing ruminating thoughts but she is trying to focus on positive self traits and showing self compassion. Client is able to identify a goal for overall wellness.  Olegario Messier, LCSW

## 2018-08-18 ENCOUNTER — Other Ambulatory Visit: Payer: Self-pay

## 2018-08-18 ENCOUNTER — Encounter (HOSPITAL_COMMUNITY): Payer: Self-pay | Admitting: Psychiatry

## 2018-08-18 ENCOUNTER — Ambulatory Visit (INDEPENDENT_AMBULATORY_CARE_PROVIDER_SITE_OTHER): Payer: 59 | Admitting: Psychiatry

## 2018-08-18 DIAGNOSIS — F3341 Major depressive disorder, recurrent, in partial remission: Secondary | ICD-10-CM
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
## 2018-08-18 NOTE — Progress Notes (Signed)
Virtual Visit via Video Note  I connected with Robyn Jennings on 08/08/2018 at  9:00 AM EDT by a video enabled telemedicine application and verified that I am speaking with the correct person using two identifiers.   I discussed the limitations of evaluation and management by telemedicine and the availability of in person appointments. The patient expressed understanding and agreed to proceed.  I discussed the assessment and treatment plan with the patient. The patient was provided an opportunity to ask questions and all were answered. The patient agreed with the plan and demonstrated an understanding of the instructions.   The patient was advised to call back or seek an in-person evaluation if the symptoms worsen or if the condition fails to improve as anticipated.  I provided 180 minutes of non-face-to-face time during this encounter.   Olegario Messier, LCSW     Daily Group Progress Note  Program: IOP  Group Time: 9am-12pm  Participation Level: Active  Behavioral Response: Appropriate  Type of Therapy:  Group Therapy; process group, psycho-educational group  Clinician met with clients via telehealth, assessing for SI/HI/psychosis and overall level of functioning. Clinician checked in with clients, processing self-care and struggles from the previous day. Clinician utilized Loaded Questions to provide clients the opportunity to practice previously learned distress tolerance stills. Clinician facilitated group discussion around self-destructive behaviors and trust in self and others. Clinician presented the psycho-educational topic of addressing Anger. Clinician and group members discussed anger as a secondary emotion and how/where expression of anger was learned and accepted growing up. Clinician and group members identified physical symptoms of 'small' anger through 'overwhelming' anger. Clinician encouraged group members to be mindful of body sensations to address uncomfortable feelings  individually at a less intense level. Clinician reviewed 'Fight Fair Rules' and 'Five Finger Communication' focused on assertive communication.  PROGRESS:  Client engaged in group discussions, identifying physical symptoms of other feelings which are more likely to occur than anger. Client discussed she is more likely to look internally to find a flaw than address anger externally.  Olegario Messier, LCSW

## 2018-08-18 NOTE — Progress Notes (Signed)
Virtual Visit via Video Note  I connected with Robyn Jennings on 08/14/2018 at  9:00 AM EDT by a video enabled telemedicine application and verified that I am speaking with the correct person using two identifiers.   I discussed the limitations of evaluation and management by telemedicine and the availability of in person appointments. The patient expressed understanding and agreed to proceed.  I discussed the assessment and treatment plan with the patient. The patient was provided an opportunity to ask questions and all were answered. The patient agreed with the plan and demonstrated an understanding of the instructions.   The patient was advised to call back or seek an in-person evaluation if the symptoms worsen or if the condition fails to improve as anticipated.  I provided 180 minutes of non-face-to-face time during this encounter.   Olegario Messier, LCSW     Daily Group Progress Note  Program: IOP  Group Time:   Participation Level: Active  Behavioral Response: Appropriate  Type of Therapy:  Group Therapy; process group, psycho-educational group  Summary of Progress:  9am-10am: Clinician checked in with group members, assessing for SI/HI/psychosis and overall level of functioning. Clinician and group members discussed recent successes and stressors. Client identified stressful event with her dog and brother. Client reports wanting to work on her relationship with her brother, but brother does not want this, leading to client having feelings of 'not good enough' and not feeling worthy. Client identified attempting self compassion and affirmations however struggles still this morning with uncomfortable feelings. 10am-11am Counselor introduced herself and asked patients to identify their current "go to" coping skill. Counselor provided psychoeducation on the importance of positive coping skills, explaining how positive coping patterns can increase resilience and provide extra resources  to help facilitate handling of a stressful experience. Counselor invited each patient to participate in an imagery activity. Counselor asked each patient to draw a calming place, imagining each detail, going through their senses and imagining what they would experience in such a calming place. Counselor allowed time for each group member to share their "calming place" and describe in detail, going through their senses, what they would experience in their calming place. Counselor ended the session by thanking everyone for participating in the group activity and sharing their "calming place." "Go to" coping skill: journaling. Calming place: Driving down a country road with her dog. Patient participated in all areas of the group, sharing her picture and describing her calming place in detail, using all her senses. 11:00 -12:00: Clinician introduced topic of "Positive Psychology". Group watched "Positive Psychology" Ted-Talk. Patients discussed how their "lens" of life effects the way they feel. Group discussed 5 strategies to help change lens. Patients identified one strategy they would be willing to try to change their "lens" for at least 21 days to create a new habit.  Patient Response: Pt participated in group discussion of positive psychology. Pt identified completing 2 minutes of exercise each day to help change "lens." Pt reports she will start this afternoon and start with 2 minutes of step-ups to help with knee pain patient is experiencing.   Olegario Messier, LCSW

## 2018-08-30 NOTE — Progress Notes (Signed)
Virtual Visit via Video Note  I connected with [name] on 08/10/18 at  9:00 AM EDT by a video enabled telemedicine application and verified that I am speaking with the correct person using two identifiers.  Location: Patient: home Provider: office   I discussed the limitations of evaluation and management by telemedicine and the availability of in person appointments. The patient expressed understanding and agreed to proceed.      Daily Group Progress Note  Program: IOP  Group Time:   Participation Level: Minimal  Behavioral Response: Appropriate and Sharing  Type of Therapy:  Group Therapy  Summary of Progress: Writer introduced self and encouraged Patient to complete a check in. Completed an ice breaker to encourage conversation. Discussion of homework assignment that was given yesterday.  Speaker was present and discussed pharmacology.   In depth discussion of the topic encouraged Patient to share thoughts and feeling of the topic.  Praised Patient for sharing.  Allowed for a brief break.  Writer introduced new topic of triggers from the therapistaid.com website. Completed handout to ensure clarity.  Patient provided feedback to the group.  Writer gave homework and provided with a new coping skill.      I discussed the assessment and treatment plan with the patient. The patient was provided an opportunity to ask questions and all were answered. The patient agreed with the plan and demonstrated an understanding of the instructions.   The patient was advised to call back or seek an in-person evaluation if the symptoms worsen or if the condition fails to improve as anticipated.  I provided 180 minutes of face-to-face time during this encounter.    Lubertha South, LCSW

## 2018-08-31 NOTE — Progress Notes (Signed)
Virtual Visit via Video Note  I connected with Robyn Jennings on 08/07/18 at  9:00 AM EDT by a video enabled telemedicine application and verified that I am speaking with the correct person using two identifiers.  Location: Patient: home Provider: office   I discussed the limitations of evaluation and management by telemedicine and the availability of in person appointments. The patient expressed understanding and agreed to proceed.     I discussed the assessment and treatment plan with the patient. The patient was provided an opportunity to ask questions and all were answered. The patient agreed with the plan and demonstrated an understanding of the instructions.   The patient was advised to call back or seek an in-person evaluation if the symptoms worsen or if the condition fails to improve as anticipated.  I provided 180 minutes of face-to-face time during this encounter.   Lubertha South, LCSW    Daily Group Progress Note  Program: IOP  Group Time: 9a-12p  Participation Level: Active  Behavioral Response: Appropriate and Sharing  Type of Therapy:  Group Therapy  Summary of Progress: Writer introduced self and encouraged Patient to complete a check in. Completed an ice breaker to encourage conversation. Discussion of homework assignment that was given yesterday.  In depth discussion of the topic "Managing Symptoms" encouraged Patient to share thoughts and feeling of the topic.  Praised Patient for sharing.  Allowed for a brief break.  Writer introduced new topic of safety planning from the therapistaid.com website. Completed handout to ensure clarity.  Patient provided feedback to the group.  Writer gave homework and provided with a new coping skill.      Lubertha South, LCSW

## 2018-09-06 NOTE — Telephone Encounter (Signed)
Pt has already sent my chart message to Dr Silvio Pate about where to go for covid testing; pt having SOB and cough.Please advise.

## 2018-09-06 NOTE — Telephone Encounter (Signed)
Junie Panning, Can you call the administrator at [facility] and see what the problem is?

## 2018-09-11 NOTE — Telephone Encounter (Signed)
Best number [phone] Pt called checking on form needs asap

## 2018-09-14 ENCOUNTER — Encounter: Payer: 59 | Attending: General Surgery | Admitting: Registered"

## 2018-09-14 DIAGNOSIS — E669 Obesity, unspecified: Secondary | ICD-10-CM | POA: Diagnosis not present

## 2018-09-14 NOTE — Progress Notes (Signed)
This visit was completed virtually due to the COVID-19 pandemic.   I spoke with Robyn Jennings and verified that I was speaking with the correct person with two patient identifiers (full name and date of birth).   I discussed the limitations related to this kind of visit and the patient is willing to proceed.  Medical Nutrition Therapy:  Appt start time: 4:30 end time: 5:29   Assessment:  Primary concerns today:   Referred to ED therapist by bariatric psychologist.   Pt states she has been taking snacks with her and keeping them handy. States it was freeing for her to know that its ok to have snacks and that "snacking" is not a bad thing. Has been having something sweet in the evening such as fruit bowl or fruit smoothies.   Has returned to work and working less hours (~32 hours). Still frustrated with [facility] because its taking a long time to have surgery. Looked into [employer] but having issues with receiving health records from previous hospital in [place].   Previous appts: Pt states she has started with  therapist, Sheila Oats. Completed ROI today (07/20/18) for [place] to collaborate care. Pt states she is exhausted with fighting against weight stigma and so many other things. States mobility is big for her and motivating her for surgery. States she has stopped seeing her last therapist. We went through [facility] for BED diagnosis and pt states she can relate to some of but it has not been recurring. States she is thinking a lot about why she wants to be in a smaller body. States she is still doing a lot of searching within herself. Pt arrives stating insurance was extended for another month. States she may begin working in August. Pt states she has been working on eating slower, chewing more, putting fork down while eating, and mindfully eating. Pt states she recently adopted a dog; he loves walking with her. States he has helped keep her on a schedule, getting up during the day, and has given her sense  of purpose lately. Pt states she has 4 brothers (3 older and 1 younger).   Pt states she wants to be successful with bariatric surgery and understands that a very small percentage are successful. Pt states she has always struggled with weight all of her life. Pt states she meets with therapist virtually on a bi-weekly basis and has been seeing her since 2016. Loves her. Pt states she and therapist have vaguely talked about her relationship with food but it doesn't go anywhere. Open to working with another  therapist who specializes in disordered eating in addition to current therapist.  Pt states she feels that losing weight will free her of judgement of herself although she knows that that will not happen. States when she was a lesser weight she struggled with positive self-image thoughts. States dad made comment about her looking good when she was a lesser weight and how "we need to get you back to looking good again". Pt states she does not like sharing about weight loss because people treated her differently during the previous times she lost weight. Like she was a different person and didn't value her for who she was. Pt reports thinking about weight, food, doing things right or wrong, and her relationship with food a lot.   Allergies: suspicion of dairy  24 hour recall: B: breakfast quesadilla-egg, cheese, vegetables, tortilla + water L: leftovers-chicken + 1 c rice  S: banana  D: potatoes + onions +  broccoli + smoked sausage   Beverages: water (at least 64 oz), crystal light  Physical activity: walks 2 mi, leisure swimming  Preferred Learning Style:   No preference indicated   Learning Readiness:   Ready  Change in progress   MEDICATIONS: See list  Estimated energy needs: 1600-2000 calories 180-225 g carbohydrates 120-150 g protein 44-56 g fat  Progress Towards Goal(s):  In progress.   Nutritional Diagnosis:  NB-1.5 Disordered eating pattern As related to undue influence  of weight on self-esteem.  As evidenced by pt reports losing weight will decrease self-judgement and reports lengthy period of time without eating, only eating 1 meal yesterday.    Intervention:  Nutrition education and counseling. Encouraged with having 3 meals a day, keeping them on-hand, and eating them as necessary. Dicussed out to have balanced snacks to satiety. Pt is doing well with behavioral changes. Pt was in agreement with goals listed.   Goals: - Have protein with evening snacks.  Teaching Method Utilized:  Visual Auditory Hands on  Handouts given during visit include:  none  Barriers to learning/adherence to lifestyle change: none identified  Demonstrated degree of understanding via:  Teach Back   Monitoring/Evaluation:  Dietary intake, exercise, and body weight in 1 month(s).

## 2018-10-19 ENCOUNTER — Encounter (HOSPITAL_COMMUNITY): Payer: Self-pay | Admitting: Psychiatry

## 2018-10-19 ENCOUNTER — Other Ambulatory Visit: Payer: Self-pay

## 2018-10-19 ENCOUNTER — Ambulatory Visit (INDEPENDENT_AMBULATORY_CARE_PROVIDER_SITE_OTHER): Payer: 59 | Admitting: Psychiatry

## 2018-10-19 DIAGNOSIS — F419 Anxiety disorder, unspecified: Secondary | ICD-10-CM | POA: Diagnosis not present

## 2018-10-19 DIAGNOSIS — F3341 Major depressive disorder, recurrent, in partial remission: Secondary | ICD-10-CM

## 2018-10-19 NOTE — Progress Notes (Signed)
Virtual Visit via Telephone Note  I connected with Robyn Jennings on 10/19/18 at  1:00 PM EDT by telephone and verified that I am speaking with the correct person using two identifiers.   I discussed the limitations, risks, security and privacy concerns of performing an evaluation and management service by telephone and the availability of in person appointments. I also discussed with the patient that there may be a patient responsible charge related to this service. The patient expressed understanding and agreed to proceed.   History of Present Illness: Patient was evaluated by phone session.  She is a 38 year old African-American female who was seen first time 2 months ago as she was referred from IOP.  At that time she was feeling very depressed, isolated, withdrawn with lack of energy and having poor sleep.  She was worried about her finances and not able to see her family member due to COVID.  She was having crying spells.  She finished IOP and that did help her a lot.  She is taking Lexapro 20 mg half tablet prescribed by PCP.  She also takes Xanax only as needed.  She admitted her job is tiring because she has to clean the place regularly.  She works at a childcare.  She is sleeping better.  She has not taken Xanax in recent weeks.  She denies drinking or using any illegal substances.  She lives by herself.  Her parents lives close by.  Patient moved from [place] to live closer to her parents.  Patient has no children.  She denies any crying spells, irritability, feeling of hopelessness or worthlessness.  She denies any suicidal thoughts.  She like to continue Lexapro which is prescribed by primary care physician.   Past psychiatric history; H/O inpatient in [place] in 2017 due to suicidal thoughts. Did IOP in [place]. Tried Wellbutrin by previous psychiatrist but did not work.  H/O IOP in July 2020 at [facility]. No h/o suicidal attempt, psychosis, mania, hallucination, PTSD or any OCD symptoms.     Psychiatric Specialty Exam: Physical Exam  ROS  There were no vitals taken for this visit.There is no height or weight on file to calculate BMI.  General Appearance: NA  Eye Contact:  NA  Speech:  Clear and Coherent and Normal Rate  Volume:  Normal  Mood:  Euthymic  Affect:  NA  Thought Process:  Goal Directed  Orientation:  Full (Time, Place, and Person)  Thought Content:  WDL  Suicidal Thoughts:  No  Homicidal Thoughts:  No  Memory:  Immediate;   Good Recent;   Good Remote;   Good  Judgement:  Good  Insight:  Good  Psychomotor Activity:  NA  Concentration:  Concentration: Good and Attention Span: Good  Recall:  Good  Fund of Knowledge:  Good  Language:  Good  Akathisia:  No  Handed:  Right  AIMS (if indicated):     Assets:  Communication Skills Desire for Improvement Housing  ADL's:  Intact  Cognition:  WNL  Sleep:   ok      Assessment and Plan: Major depressive disorder, recurrent.  Anxiety.  Patient is a stable on her current medication.  She is taking Lexapro prescribed by PCP and Xanax 0.25 mg as needed.  She has not taken Xanax in past few weeks.  Her sleep is good.  She likes her job.  Discussed medication side effects and benefits.  She is seeing therapist [facility] for therapy.  I encouraged to continue therapy.  Recommended to call us back if she has any question or any concern.  Follow-up in 3 months.  Follow Up Instructions:    I discussed the assessment and treatment plan with the patient. The patient was provided an opportunity to ask questions and all were answered. The patient agreed with the plan and demonstrated an understanding of the instructions.   The patient was advised to call back or seek an in-person evaluation if the symptoms worsen or if the condition fails to improve as anticipated.  I provided 20 minutes of non-face-to-face time during this encounter.   Kathlee Nations, MD

## 2018-10-26 ENCOUNTER — Encounter: Payer: 59 | Admitting: Registered"

## 2018-11-25 ENCOUNTER — Ambulatory Visit (INDEPENDENT_AMBULATORY_CARE_PROVIDER_SITE_OTHER): Payer: 59 | Admitting: Psychology

## 2018-11-25 DIAGNOSIS — F3341 Major depressive disorder, recurrent, in partial remission: Secondary | ICD-10-CM

## 2018-11-30 ENCOUNTER — Ambulatory Visit: Payer: Self-pay | Admitting: General Surgery

## 2018-11-30 NOTE — H&P (Signed)
Robyn Jennings Documented: 11/30/2018 8:35 AM Location: [facility] Patient #: [id number] DOB: 1980/01/25 Single / Language: Robyn Jennings / Race: Black or African American Female  History of Present Illness Randall Hiss M. Taiga Lupinacci MD; 11/30/2018 9:42 AM) The patient is a 38 year old female who presents for a bariatric surgery evaluation. She comes in today for follow-up visit regarding her severe obesity and comorbidities in relation to weight loss surgery. She denies any significant medical changes since we initially met in January. She did have some worsening left knee issues back in the spring which required steroid injections. She also had physical therapy. She states its better but still occasionally has issues. She denies any medical changes since I initially met her. She denies any trips the emergency room hospital. Still occasionally has food related reflux. No chest pain, chest pressure, shortness of breath, dyspnea on exertion. No smoking. No blood thinners. She sees a psychiatrist regularly as well as gets frequent mental health counseling. She has a mental health support system in place for after surgery. Her upper GI, chest x-ray and EKG were unremarkable. Labs in the summer namely a metabolic panel, CBC, lipid panel, TSH were all normal. H. pylori negative. No vitamin deficiencies. A1c 5.2   01/2018 She is referred by Dr Kara Pacer for evaluation of weight loss surgery. She completed our Neurosurgeon. She is interested in the gastric bypass. She would like to lose around 200 pounds. She has lost significant amount of weight a few years ago through an intensive medical weight loss program through the hospital system in [place]. She did a very low calorie diet for 6 months and was able to lose 120 pounds but after she completed the program she slowly regained the weight. She is also tried Weight Watchers on several occasions about without any long-term success. She did see a  weight loss surgeon in [place] about a year ago to discuss weight loss surgery but she ended up moving to [place] to be close to her parents.  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 She states that she has a bulging L5 disc  She denies any chest pain, chest pressure, shortness of breath, orthopnea, paroxysmal nocturnal dyspnea, dyspnea on exertion. She denies any TIAs or amaurosis fugax. She denies any peripheral edema. She does not have a personal history of blood clots. She states that her mother had a blood clot at around age 60 but he has had numerous medical problems throughout his life and has poor veins. No other family members have had blood clots. She does not fall asleep during meetings or at traffic lights.  She denies any abdominal pain, diarrhea, constipation or prior abdominal surgery. She denies any difficulty eating foods or drinking liquids. She may occasionally have some heartburn after eating spicy foods which is managed with tums. She denies any melena or hematochezia. She denies any dysuria or hematuria. She has chronic right knee pain. She has chronic low central back pain that radiates down her left thigh. She states that she had imaging at her chiropractor's office that showed a bulging L5 disc. She takes medicine for depression and occasional anxiety. She denies any severe headaches or vision changes. She does not use tobacco or drugs. She drinks alcohol occasionally. 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 Redmond Pulling, MD; 11/30/2018 9:44 AM) FATIGUE, UNSPECIFIED TYPE (R53.83) PRE-BARIATRIC SURGERY NUTRITION EVALUATION (Z71.3) MORBID OBESITY WITH  BMI OF 60.0-69.9, ADULT (E66.01)  Past Surgical History Randall Hiss M. Redmond Pulling, MD; 11/30/2018 9:44 AM) Cesarean Section - Multiple Foot Surgery Left.  Diagnostic Studies History Randall Hiss M. Redmond Pulling, MD;  11/30/2018 9:44 AM) Colonoscopy never Mammogram never Pap Smear 1-5 years ago  Allergies (Tanisha A. Owens Shark, RMA; 11/30/2018 8:37 AM) Venlafaxine HCl *ANTIDEPRESSANTS* buPROPion HCl *ANTIDEPRESSANTS* Cetirizine HCl *ANTIHISTAMINES* Fluticasone Propionate *CHEMICALS* Allergies Reconciled  Medication History (Tanisha A. Owens Shark, [place]; 11/30/2018 8:37 AM) Escitalopram Oxalate (20MG Tablet, Oral) Active. Albuterol Sulfate HFA (108 (90 Base)MCG/ACT Aerosol Soln, Inhalation) Active. ALPRAZolam (0.25MG Tablet, Oral) Active. Multi-Day (Oral) Active. Vitamin D3 (Oral) Specific strength unknown - Active. Medications Reconciled  Social History Randall Hiss M. Redmond Pulling, MD; 11/30/2018 9:44 AM) Alcohol use Occasional alcohol use. Caffeine use Carbonated beverages, Coffee, Tea. Illicit drug use Uses socially only. Tobacco use Never smoker.  Family History Randall Hiss M. Redmond Pulling, MD; 11/30/2018 9:44 AM) Heart Disease Father. Respiratory Condition Father. Thyroid problems Mother.  Pregnancy / Birth History Randall Hiss M. Redmond Pulling, MD; 11/30/2018 9:44 AM) Age at menarche 16 years. Contraceptive History Oral contraceptives. Gravida 0 Para 0 Regular periods  Other Problems Randall Hiss M. Redmond Pulling, MD; 11/30/2018 9:44 AM) ANXIETY, GENERALIZED (F41.1) CHRONIC ASTHMA WITHOUT COMPLICATION, UNSPECIFIED ASTHMA SEVERITY, UNSPECIFIED WHETHER PERSISTENT (J45.909) DEPRESSION, CONTROLLED (F32.9) SLEEP APNEA, OBSTRUCTIVE (G47.33) CHRONIC LOW BACK PAIN WITH LEFT-SIDED SCIATICA, UNSPECIFIED BACK PAIN LATERALITY (M54.42)     Review of Systems Randall Hiss M. Jacen Carlini MD; 11/30/2018 9:42 AM) General Not Present- Appetite Loss, Chills, Fatigue, Fever, Night Sweats, Weight Gain and Weight Loss. Skin Present- Rash. Not Present- Change in Wart/Mole, Dryness, Hives, Jaundice, New Lesions, Non-Healing Wounds and Ulcer. HEENT Present- Seasonal Allergies. Not Present- Earache, Hearing Loss, Hoarseness, Nose Bleed, Oral  Ulcers, Ringing in the Ears, Sinus Pain, Sore Throat, Visual Disturbances, Wears glasses/contact lenses and Yellow Eyes. Respiratory Not Present- Bloody sputum, Chronic Cough, Difficulty Breathing, Snoring and Wheezing. Breast Not Present- Breast Mass, Breast Pain, Nipple Discharge and Skin Changes. Cardiovascular Not Present- Chest Pain, Difficulty Breathing Lying Down, Leg Cramps, Palpitations, Rapid Heart Rate, Shortness of Breath and Swelling of Extremities. Gastrointestinal Not Present- Abdominal Pain, Bloating, Bloody Stool, Change in Bowel Habits, Chronic diarrhea, Constipation, Difficulty Swallowing, Excessive gas, Gets full quickly at meals, Hemorrhoids, Indigestion, Nausea, Rectal Pain and Vomiting. Female Genitourinary Not Present- Frequency, Nocturia, Painful Urination, Pelvic Pain and Urgency. Musculoskeletal Present- Back Pain and Joint Stiffness. Not Present- Joint Pain, Muscle Pain, Muscle Weakness and Swelling of Extremities. Neurological Not Present- Decreased Memory, Fainting, Headaches, Numbness, Seizures, Tingling, Tremor, Trouble walking and Weakness. Psychiatric Not Present- Anxiety, Bipolar, Change in Sleep Pattern, Depression, Fearful and Frequent crying. Endocrine Not Present- Cold Intolerance, Excessive Hunger, Hair Changes, Heat Intolerance, Hot flashes and New Diabetes. Hematology Not Present- Blood Thinners, Easy Bruising, Excessive bleeding, Gland problems, HIV and Persistent Infections.  Vitals (Tanisha A. Brown RMA; 11/30/2018 8:36 AM) 11/30/2018 8:35 AM Weight: 454.8 lb Height: 67in Body Surface Area: 2.87 m Body Mass Index: 71.23 kg/m  Temp.: 97.38F  Pulse: 105 (Regular)  BP: 132/86 (Sitting, Left Arm, Standard)        Physical Exam Randall Hiss M. Roderick Sweezy MD; 11/30/2018 9:42 AM)  General Mental Status-Alert. General Appearance-Consistent with stated age. Hydration-Well hydrated. Voice-Normal. Note: severe obesity; evenly  distributed  Head and Neck Head-normocephalic, atraumatic with no lesions or palpable masses. Trachea-midline. Thyroid Gland Characteristics - normal size and consistency.  Eye Eyeball - Bilateral-Extraocular movements intact. Sclera/Conjunctiva - Bilateral-No scleral icterus.  ENMT Mouth and Throat -Note:lips intact.  Note: nml ext ears  Chest and Lung  Exam Chest and lung exam reveals -quiet, even and easy respiratory effort with no use of accessory muscles and on auscultation, normal breath sounds, no adventitious sounds and normal vocal resonance. Inspection Chest Wall - Normal. Back - normal.  Breast - Did not examine.  Cardiovascular Cardiovascular examination reveals -normal heart sounds, regular rate and rhythm with no murmurs and normal pedal pulses bilaterally.  Abdomen Inspection Inspection of the abdomen reveals - No Hernias. Skin - Scar - no surgical scars. Palpation/Percussion Palpation and Percussion of the abdomen reveal - Soft, Non Tender, No Rebound tenderness, No Rigidity (guarding) and No hepatosplenomegaly. Auscultation Auscultation of the abdomen reveals - Bowel sounds normal.  Peripheral Vascular Upper Extremity Palpation - Pulses bilaterally normal.  Neurologic Neurologic evaluation reveals -alert and oriented x 3 with no impairment of recent or remote memory. Mental Status-Normal.  Neuropsychiatric The patient's mood and affect are described as -normal. Judgment and Insight-insight is appropriate concerning matters relevant to self.  Musculoskeletal Normal Exam - Left-Upper Extremity Strength Normal and Lower Extremity Strength Normal. Normal Exam - Right-Upper Extremity Strength Normal and Lower Extremity Strength Normal.  Lymphatic Head & Neck  General Head & Neck Lymphatics: Bilateral - Description - Normal. Axillary - Did not examine. Femoral & Inguinal - Did not examine.    Assessment & Plan Randall Hiss M.  Rosaleigh Brazzel MD; 11/30/2018 9:44 AM)  MORBID OBESITY WITH BMI OF 60.0-69.9, ADULT (E66.01) Impression: The patient meets weight loss surgery criteria. I think the patient would be an acceptable candidate for Laparoscopic Roux-en-Y Gastric bypass.  We rediscussed laparoscopic Roux-en-Y gastric bypass. We discussed the preoperative, operative and postoperative process. Using diagrams, I explained the surgery in detail including the performance of an EGD near the end of the surgery. We discussed the typical hospital course including a 2-3 day stay baring any complications.  We discussed the typical postoperative course. We discussed the typical issues that we see. She has her preoperative education class tomorrow. We discussed the importance of the preoperative meal plan to help shrink the liver  We reviewed her MBASQIP risk/benefit calculator and she was given a copy.  All of her questions were asked and answered.  Current Plans Pt Education - EMW_preopbariatric  ANXIETY, GENERALIZED (F41.1)   CHRONIC ASTHMA WITHOUT COMPLICATION, UNSPECIFIED ASTHMA SEVERITY, UNSPECIFIED WHETHER PERSISTENT (J45.909)   SLEEP APNEA, OBSTRUCTIVE (G47.33)   CHRONIC LOW BACK PAIN WITH LEFT-SIDED SCIATICA, UNSPECIFIED BACK PAIN LATERALITY (M54.42)   DEPRESSION, CONTROLLED (F32.9)  [name]. Redmond Pulling, MD, FACS General, Bariatric, & Minimally Invasive Surgery [facility], [place]

## 2018-11-30 NOTE — H&P (View-Only) (Signed)
Robyn Jennings Documented: 11/30/2018 8:35 AM Location: [facility] Patient #: [id number] DOB: [birth year] Single / Language: Robyn Jennings / Race: Black or African American Female  History of Present Illness Randall Hiss M. Darrold Bezek MD; 11/30/2018 9:42 AM) The patient is a 38 year old female who presents for a bariatric surgery evaluation. She comes in today for follow-up visit regarding her severe obesity and comorbidities in relation to weight loss surgery. She denies any significant medical changes since we initially met in January. She did have some worsening left knee issues back in the spring which required steroid injections. She also had physical therapy. She states its better but still occasionally has issues. She denies any medical changes since I initially met her. She denies any trips the emergency room hospital. Still occasionally has food related reflux. No chest pain, chest pressure, shortness of breath, dyspnea on exertion. No smoking. No blood thinners. She sees a psychiatrist regularly as well as gets frequent mental health counseling. She has a mental health support system in place for after surgery. Her upper GI, chest x-ray and EKG were unremarkable. Labs in the summer namely a metabolic panel, CBC, lipid panel, TSH were all normal. H. pylori negative. No vitamin deficiencies. A1c 5.2   01/2018 She is referred by Dr Kara Pacer for evaluation of weight loss surgery. She completed our Neurosurgeon. She is interested in the gastric bypass. She would like to lose around 200 pounds. She has lost significant amount of weight a few years ago through an intensive medical weight loss program through the hospital system in [place]. She did a very low calorie diet for 6 months and was able to lose 120 pounds but after she completed the program she slowly regained the weight. She is also tried Weight Watchers on several occasions about without any long-term success. She did see a  weight loss surgeon in [place] about a year ago to discuss weight loss surgery but she ended up moving to [place] to be close to her parents.  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 She states that she has a bulging L5 disc  She denies any chest pain, chest pressure, shortness of breath, orthopnea, paroxysmal nocturnal dyspnea, dyspnea on exertion. She denies any TIAs or amaurosis fugax. She denies any peripheral edema. She does not have a personal history of blood clots. She states that her mother had a blood clot at around age 47 but he has had numerous medical problems throughout his life and has poor veins. No other family members have had blood clots. She does not fall asleep during meetings or at traffic lights.  She denies any abdominal pain, diarrhea, constipation or prior abdominal surgery. She denies any difficulty eating foods or drinking liquids. She may occasionally have some heartburn after eating spicy foods which is managed with tums. She denies any melena or hematochezia. She denies any dysuria or hematuria. She has chronic right knee pain. She has chronic low central back pain that radiates down her left thigh. She states that she had imaging at her chiropractor's office that showed a bulging L5 disc. She takes medicine for depression and occasional anxiety. She denies any severe headaches or vision changes. She does not use tobacco or drugs. She drinks alcohol occasionally. 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 Redmond Pulling, MD; 11/30/2018 9:44 AM) FATIGUE, UNSPECIFIED TYPE (R53.83) PRE-BARIATRIC SURGERY NUTRITION EVALUATION (Z71.3) MORBID OBESITY WITH  BMI OF 60.0-69.9, ADULT (E66.01)  Past Surgical History Randall Hiss M. Redmond Pulling, MD; 11/30/2018 9:44 AM) Cesarean Section - Multiple Foot Surgery Left.  Diagnostic Studies History Randall Hiss M. Redmond Pulling, MD;  11/30/2018 9:44 AM) Colonoscopy never Mammogram never Pap Smear 1-5 years ago  Allergies (Tanisha A. Owens Shark, RMA; 11/30/2018 8:37 AM) Venlafaxine HCl *ANTIDEPRESSANTS* buPROPion HCl *ANTIDEPRESSANTS* Cetirizine HCl *ANTIHISTAMINES* Fluticasone Propionate *CHEMICALS* Allergies Reconciled  Medication History (Tanisha A. Owens Shark, [place]; 11/30/2018 8:37 AM) Escitalopram Oxalate (20MG Tablet, Oral) Active. Albuterol Sulfate HFA (108 (90 Base)MCG/ACT Aerosol Soln, Inhalation) Active. ALPRAZolam (0.25MG Tablet, Oral) Active. Multi-Day (Oral) Active. Vitamin D3 (Oral) Specific strength unknown - Active. Medications Reconciled  Social History Randall Hiss M. Redmond Pulling, MD; 11/30/2018 9:44 AM) Alcohol use Occasional alcohol use. Caffeine use Carbonated beverages, Coffee, Tea. Illicit drug use Uses socially only. Tobacco use Never smoker.  Family History Randall Hiss M. Redmond Pulling, MD; 11/30/2018 9:44 AM) Heart Disease Father. Respiratory Condition Father. Thyroid problems Mother.  Pregnancy / Birth History Randall Hiss M. Redmond Pulling, MD; 11/30/2018 9:44 AM) Age at menarche 42 years. Contraceptive History Oral contraceptives. Gravida 0 Para 0 Regular periods  Other Problems Randall Hiss M. Redmond Pulling, MD; 11/30/2018 9:44 AM) ANXIETY, GENERALIZED (F41.1) CHRONIC ASTHMA WITHOUT COMPLICATION, UNSPECIFIED ASTHMA SEVERITY, UNSPECIFIED WHETHER PERSISTENT (J45.909) DEPRESSION, CONTROLLED (F32.9) SLEEP APNEA, OBSTRUCTIVE (G47.33) CHRONIC LOW BACK PAIN WITH LEFT-SIDED SCIATICA, UNSPECIFIED BACK PAIN LATERALITY (M54.42)     Review of Systems Randall Hiss M. Thecla Forgione MD; 11/30/2018 9:42 AM) General Not Present- Appetite Loss, Chills, Fatigue, Fever, Night Sweats, Weight Gain and Weight Loss. Skin Present- Rash. Not Present- Change in Wart/Mole, Dryness, Hives, Jaundice, New Lesions, Non-Healing Wounds and Ulcer. HEENT Present- Seasonal Allergies. Not Present- Earache, Hearing Loss, Hoarseness, Nose Bleed, Oral  Ulcers, Ringing in the Ears, Sinus Pain, Sore Throat, Visual Disturbances, Wears glasses/contact lenses and Yellow Eyes. Respiratory Not Present- Bloody sputum, Chronic Cough, Difficulty Breathing, Snoring and Wheezing. Breast Not Present- Breast Mass, Breast Pain, Nipple Discharge and Skin Changes. Cardiovascular Not Present- Chest Pain, Difficulty Breathing Lying Down, Leg Cramps, Palpitations, Rapid Heart Rate, Shortness of Breath and Swelling of Extremities. Gastrointestinal Not Present- Abdominal Pain, Bloating, Bloody Stool, Change in Bowel Habits, Chronic diarrhea, Constipation, Difficulty Swallowing, Excessive gas, Gets full quickly at meals, Hemorrhoids, Indigestion, Nausea, Rectal Pain and Vomiting. Female Genitourinary Not Present- Frequency, Nocturia, Painful Urination, Pelvic Pain and Urgency. Musculoskeletal Present- Back Pain and Joint Stiffness. Not Present- Joint Pain, Muscle Pain, Muscle Weakness and Swelling of Extremities. Neurological Not Present- Decreased Memory, Fainting, Headaches, Numbness, Seizures, Tingling, Tremor, Trouble walking and Weakness. Psychiatric Not Present- Anxiety, Bipolar, Change in Sleep Pattern, Depression, Fearful and Frequent crying. Endocrine Not Present- Cold Intolerance, Excessive Hunger, Hair Changes, Heat Intolerance, Hot flashes and New Diabetes. Hematology Not Present- Blood Thinners, Easy Bruising, Excessive bleeding, Gland problems, HIV and Persistent Infections.  Vitals (Tanisha A. Brown RMA; 11/30/2018 8:36 AM) 11/30/2018 8:35 AM Weight: 454.8 lb Height: 67in Body Surface Area: 2.87 m Body Mass Index: 71.23 kg/m  Temp.: 97.57F  Pulse: 105 (Regular)  BP: 132/86 (Sitting, Left Arm, Standard)        Physical Exam Randall Hiss M. Asheton Viramontes MD; 11/30/2018 9:42 AM)  General Mental Status-Alert. General Appearance-Consistent with stated age. Hydration-Well hydrated. Voice-Normal. Note: severe obesity; evenly  distributed  Head and Neck Head-normocephalic, atraumatic with no lesions or palpable masses. Trachea-midline. Thyroid Gland Characteristics - normal size and consistency.  Eye Eyeball - Bilateral-Extraocular movements intact. Sclera/Conjunctiva - Bilateral-No scleral icterus.  ENMT Mouth and Throat -Note:lips intact.  Note: nml ext ears  Chest and Lung  Exam Chest and lung exam reveals -quiet, even and easy respiratory effort with no use of accessory muscles and on auscultation, normal breath sounds, no adventitious sounds and normal vocal resonance. Inspection Chest Wall - Normal. Back - normal.  Breast - Did not examine.  Cardiovascular Cardiovascular examination reveals -normal heart sounds, regular rate and rhythm with no murmurs and normal pedal pulses bilaterally.  Abdomen Inspection Inspection of the abdomen reveals - No Hernias. Skin - Scar - no surgical scars. Palpation/Percussion Palpation and Percussion of the abdomen reveal - Soft, Non Tender, No Rebound tenderness, No Rigidity (guarding) and No hepatosplenomegaly. Auscultation Auscultation of the abdomen reveals - Bowel sounds normal.  Peripheral Vascular Upper Extremity Palpation - Pulses bilaterally normal.  Neurologic Neurologic evaluation reveals -alert and oriented x 3 with no impairment of recent or remote memory. Mental Status-Normal.  Neuropsychiatric The patient's mood and affect are described as -normal. Judgment and Insight-insight is appropriate concerning matters relevant to self.  Musculoskeletal Normal Exam - Left-Upper Extremity Strength Normal and Lower Extremity Strength Normal. Normal Exam - Right-Upper Extremity Strength Normal and Lower Extremity Strength Normal.  Lymphatic Head & Neck  General Head & Neck Lymphatics: Bilateral - Description - Normal. Axillary - Did not examine. Femoral & Inguinal - Did not examine.    Assessment & Plan Randall Hiss M.  Keshayla Schrum MD; 11/30/2018 9:44 AM)  MORBID OBESITY WITH BMI OF 60.0-69.9, ADULT (E66.01) Impression: The patient meets weight loss surgery criteria. I think the patient would be an acceptable candidate for Laparoscopic Roux-en-Y Gastric bypass.  We rediscussed laparoscopic Roux-en-Y gastric bypass. We discussed the preoperative, operative and postoperative process. Using diagrams, I explained the surgery in detail including the performance of an EGD near the end of the surgery. We discussed the typical hospital course including a 2-3 day stay baring any complications.  We discussed the typical postoperative course. We discussed the typical issues that we see. She has her preoperative education class tomorrow. We discussed the importance of the preoperative meal plan to help shrink the liver  We reviewed her MBASQIP risk/benefit calculator and she was given a copy.  All of her questions were asked and answered.  Current Plans Pt Education - EMW_preopbariatric  ANXIETY, GENERALIZED (F41.1)   CHRONIC ASTHMA WITHOUT COMPLICATION, UNSPECIFIED ASTHMA SEVERITY, UNSPECIFIED WHETHER PERSISTENT (J45.909)   SLEEP APNEA, OBSTRUCTIVE (G47.33)   CHRONIC LOW BACK PAIN WITH LEFT-SIDED SCIATICA, UNSPECIFIED BACK PAIN LATERALITY (M54.42)   DEPRESSION, CONTROLLED (F32.9)  [name]. Redmond Pulling, MD, FACS General, Bariatric, & Minimally Invasive Surgery [facility], [place]

## 2018-12-01 ENCOUNTER — Other Ambulatory Visit: Payer: Self-pay

## 2018-12-01 ENCOUNTER — Encounter: Payer: Managed Care, Other (non HMO) | Attending: General Surgery | Admitting: Dietician

## 2018-12-01 VITALS — Ht 69.0 in | Wt >= 6400 oz

## 2018-12-01 DIAGNOSIS — Z6841 Body Mass Index (BMI) 40.0 and over, adult: Secondary | ICD-10-CM | POA: Diagnosis not present

## 2018-12-01 DIAGNOSIS — Z713 Dietary counseling and surveillance: Secondary | ICD-10-CM | POA: Diagnosis not present

## 2018-12-01 NOTE — Progress Notes (Signed)
Pre-Operative Nutrition Class:  Appt start time: 0900   End time:  1100.  Patient was seen on 12/01/18 for Pre-Operative Bariatric Surgery Education at [facility] at [facility].   Surgery date: 12/19/18 Surgery type: RNY Gastric Bypass Start weight at [facility]: 456.5lbs Weight today: 455.9lbs (new scale)  InBody  BODY COMP RESULTS  Patient declined body composition measurement today.   Samples given per MNT protocol. Patient educated on appropriate usage: Celebrate Vitamins Multivitamin  Lot # [id number],  Exp: 02/2019; Lot# [id number], Exp: 05/2020  Quick melt B12  Lot# [id number], Exp: 01/2019  Iron 21m [id number] Exp: 06/2020; 467mLot# [id number]: 06/2020  Celebrate Vitamins Calcium Citrate   Lot # [id number]: 02/2019; Lot# [id number]: 09/2019; Lot# [id number] 06/2019; Lot# [id number], exp: 07/2019; Lot# [id number]: 08/2019; Lot# 0006, exp: 07/2019; Lot# 0002, exp: 07/2019; Lot# [id number], exp: 11/2-21  UnRenee Painrotein Powder   Lot # [id number]: 06/2019; Lot# [id number]: 06/2019; Lot# [id number]: 06/2019  UnLoranerotein Shake   Lot# [id number]: 12/31/18, or LoNZV#[id number]: 01/01/19  The following the learning objectives were met by the patient during this course:  Identify Pre-Op Dietary Goals and will begin 2 weeks pre-operatively  Identify appropriate sources of fluids and proteins   State protein recommendations and appropriate sources pre and post-operatively  Identify Post-Operative Dietary Goals and will follow for 2 weeks post-operatively  Identify appropriate multivitamin and calcium sources  Describe the need for physical activity post-operatively and will follow MD recommendations  State when to call healthcare provider regarding medication questions or post-operative complications  Handouts given during class include:  Pre-Op Bariatric Surgery Diet Handout  Protein Shake Handout  Post-Op Bariatric Surgery Nutrition Handout  BELT  Program Information Flyer  Support Group Information Flyer  [facility] Bariatric Supplements Price List  [place]: [place] for diet advancement per MD.

## 2018-12-14 NOTE — Patient Instructions (Addendum)
DUE TO COVID-19 ONLY ONE VISITOR IS ALLOWED TO COME WITH YOU AND STAY IN THE WAITING ROOM ONLY DURING PRE OP AND PROCEDURE DAY OF SURGERY. THE 1 VISITOR MAY VISIT WITH YOU AFTER SURGERY IN YOUR PRIVATE ROOM DURING VISITING HOURS ONLY!  YOU NEED TO HAVE A COVID 19 TEST ON__12/4_____ @_12 :20 pm______, THIS TEST MUST BE DONE BEFORE SURGERY, COME  [address], [place] , [address].  ([place]) ONCE YOUR COVID TEST IS COMPLETED, PLEASE BEGIN THE QUARANTINE INSTRUCTIONS AS OUTLINED IN YOUR HANDOUT.                Grabiela Wohlford   Your procedure is scheduled on: 12/19/18   Report to [facility]  Entrance Report to Short Stay at  5:30 AM     Call this number if you have problems the morning of surgery [place], NO CHEWING GUM [place].    NO SOLID FOOD AFTER 6:00 PM THE NIGHT BEFORE YOUR SURGERY.  YOU MAY DRINK CLEAR FLUIDS.   CLEAR LIQUID DIET   Foods Allowed                                                                     Foods Excluded  Coffee and tea, regular and decaf                             liquids that you cannot  Plain Jell-O any favor except red or purple                                           see through such as: Fruit ices (not with fruit pulp)                                     milk, soups, orange juice  Iced Popsicles                                    All solid food Carbonated beverages, regular and diet                                    Cranberry, grape and apple juices Sports drinks like Gatorade Lightly seasoned clear broth or consume(fat free) Sugar, honey syrup  Sample Menu Breakfast                                Lunch                                     Supper Cranberry juice  Beef broth                            Chicken broth Jell-O                                     Grape juice                           Apple juice Coffee  or tea                        Jell-O                                      Popsicle                                                Coffee or tea                        Coffee or tea  _____________________________________________________________________     THE G2 DRINK YOU DRINK BEFORE YOU LEAVE HOME WILL BE THE LAST FLUIDS YOU DRINK BEFORE SURGERY.  PAIN IS EXPECTED AFTER SURGERY AND WILL NOT BE COMPLETELY ELIMINATED. AMBULATION AND TYLENOL WILL HELP REDUCE INCISIONAL AND GAS PAIN. MOVEMENT IS KEY!  YOU ARE EXPECTED TO BE OUT OF BED WITHIN 4 HOURS OF ADMISSION TO YOUR PATIENT ROOM.  SITTING IN THE RECLINER THROUGHOUT THE DAY IS IMPORTANT FOR DRINKING FLUIDS AND MOVING GAS THROUGHOUT THE GI TRACT.  COMPRESSION STOCKINGS SHOULD BE WORN [facility] STAY UNLESS YOU ARE WALKING.   INCENTIVE SPIROMETER SHOULD BE USED EVERY HOUR WHILE AWAKE TO DECREASE POST-OPERATIVE COMPLICATIONS SUCH AS PNEUMONIA.  WHEN DISCHARGED HOME, IT IS IMPORTANT TO CONTINUE TO WALK EVERY HOUR AND USE THE INCENTIVE SPIROMETER EVERY HOUR.   YOU MAY HAVE CLEAR LIQUIDS FROM MIDNIGHT UNTIL 4:30 AM.   At 4:30AM Please finish the prescribed Pre-Surgery Gatorade drink.   Nothing by mouth after you finish the Gatorade drink !    Take these medicines the morning of surgery with A SIP OF WATER: SINGULAR, LEXAPRO, XANAX IF NEEDED   USE YOUR INHALER AND BRING IT TO THE HOSPITAL  BRING YOUR MASK AND TUBING TO THE HOSPITAL              You may not have any metal on your body including hair pins and              piercings  Do not wear jewelry, make-up, lotions, powders or perfumes, deodorant             Do not wear nail polish on your fingernails.  Do not shave  48 hours prior to surgery.             Do not bring valuables to the hospital. [facility] IS NOT             RESPONSIBLE   FOR VALUABLES.  Contacts, dentures or bridgework may not be worn into surgery.       Patients discharged the day of surgery  will  not be allowed to drive home. IF YOU ARE HAVING SURGERY AND GOING HOME THE SAME DAY, YOU MUST HAVE AN ADULT TO DRIVE YOU HOME AND BE WITH YOU FOR 24 HOURS. YOU MAY GO HOME BY TAXI OR UBER OR ORTHERWISE, BUT AN ADULT MUST ACCOMPANY YOU HOME AND STAY WITH YOU FOR 24 HOURS.  Name and phone number of your driver:  Special Instructions: N/A              Please read over the following fact sheets you were given: _____________________________________________________________________             [facility] - Preparing for Surgery  Before surgery, you can play an important role.   Because skin is not sterile, your skin needs to be as free of germs as possible.  You  can reduce the number of germs on your skin by washing with CHG (chlorahexidine gluconate) soap before surgery .  CHG is an antiseptic cleaner which kills germs and bonds with the skin to continue killing germs even after washing. Please DO NOT use if you have an allergy to CHG or antibacterial soaps.   If your skin becomes reddened/irritated stop using the CHG and inform your nurse when you arrive at Short Stay. Do not shave (including legs and underarms) for at least 48 hours prior to the first CHG shower.  Please follow these instructions carefully:  1.  Shower with CHG Soap the night before surgery and the  morning of Surgery.  2.  If you choose to wash your hair, wash your hair first as usual with your  normal  shampoo.  3.  After you shampoo, rinse your hair and body thoroughly to remove the  shampoo.                                        4.  Use CHG as you would any other liquid soap.  You can apply chg directly  to the skin and wash                       Gently with a scrungie or clean washcloth.  5.  Apply the CHG Soap to your body ONLY FROM THE NECK DOWN.   Do not use on face/ open                           Wound or open sores. Avoid contact with eyes, ears mouth and genitals (private parts).                       Wash  face,  Genitals (private parts) with your normal soap.             6.  Wash thoroughly, paying special attention to the area where your surgery  will be performed.  7.  Thoroughly rinse your body with warm water from the neck down.  8.  DO NOT shower/wash with your normal soap after using and rinsing off  the CHG Soap.             9.  Pat yourself dry with a clean towel.            10.  Wear clean pajamas.            11.  Place clean sheets on your  bed the night of your first shower and do not  sleep with pets. Day of Surgery : Do not apply any lotions/deodorants the morning of surgery.  Please wear clean clothes to the hospital/surgery center.  FAILURE TO FOLLOW THESE INSTRUCTIONS MAY RESULT IN THE CANCELLATION OF YOUR SURGERY PATIENT SIGNATURE_________________________________  NURSE SIGNATURE__________________________________  ________________________________________________________________________   Rogelia Mire  An incentive spirometer is a tool that can help keep your lungs clear and active. This tool measures how well you are filling your lungs with each breath. Taking long deep breaths may help reverse or decrease the chance of developing breathing (pulmonary) problems (especially infection) following:  A long period of time when you are unable to move or be active. BEFORE THE PROCEDURE   If the spirometer includes an indicator to show your best effort, your nurse or respiratory therapist will set it to a desired goal.  If possible, sit up straight or lean slightly forward. Try not to slouch.  Hold the incentive spirometer in an upright position. INSTRUCTIONS FOR USE  1. Sit on the edge of your bed if possible, or sit up as far as you can in bed or on a chair. 2. Hold the incentive spirometer in an upright position. 3. Breathe out normally. 4. Place the mouthpiece in your mouth and seal your lips tightly around it. 5. Breathe in slowly and as deeply as possible,  raising the piston or the ball toward the top of the column. 6. Hold your breath for 3-5 seconds or for as long as possible. Allow the piston or ball to fall to the bottom of the column. 7. Remove the mouthpiece from your mouth and breathe out normally. 8. Rest for a few seconds and repeat Steps 1 through 7 at least 10 times every 1-2 hours when you are awake. Take your time and take a few normal breaths between deep breaths. 9. The spirometer may include an indicator to show your best effort. Use the indicator as a goal to work toward during each repetition. 10. After each set of 10 deep breaths, practice coughing to be sure your lungs are clear. If you have an incision (the cut made at the time of surgery), support your incision when coughing by placing a pillow or rolled up towels firmly against it. Once you are able to get out of bed, walk around indoors and cough well. You may stop using the incentive spirometer when instructed by your caregiver.  RISKS AND COMPLICATIONS  Take your time so you do not get dizzy or light-headed.  If you are in pain, you may need to take or ask for pain medication before doing incentive spirometry. It is harder to take a deep breath if you are having pain. AFTER USE  Rest and breathe slowly and easily.  It can be helpful to keep track of a log of your progress. Your caregiver can provide you with a simple table to help with this. If you are using the spirometer at home, follow these instructions: SEEK MEDICAL CARE IF:   You are having difficultly using the spirometer.  You have trouble using the spirometer as often as instructed.  Your pain medication is not giving enough relief while using the spirometer.  You develop fever of 100.5 F (38.1 C) or higher. SEEK IMMEDIATE MEDICAL CARE IF:   You cough up bloody sputum that had not been present before.  You develop fever of 102 F (38.9 C) or greater.  You develop worsening pain at  or near the  incision site. MAKE SURE YOU:   Understand these instructions.  Will watch your condition.  Will get help right away if you are not doing well or get worse. Document Released: 05/10/2006 Document Revised: 03/22/2011 Document Reviewed: 07/11/2006 [facility] Patient Information 2014 [place], [place].   ________________________________________________________________________

## 2018-12-15 ENCOUNTER — Other Ambulatory Visit (HOSPITAL_COMMUNITY)
Admission: RE | Admit: 2018-12-15 | Discharge: 2018-12-15 | Disposition: A | Discharge status: Home / Self Care | Payer: 59 | Source: Ambulatory Visit | Attending: General Surgery | Admitting: General Surgery

## 2018-12-15 ENCOUNTER — Encounter (HOSPITAL_COMMUNITY)
Admission: RE | Admit: 2018-12-15 | Discharge: 2018-12-15 | Disposition: A | Discharge status: Home / Self Care | Payer: 59 | Source: Ambulatory Visit | Attending: General Surgery | Admitting: General Surgery

## 2018-12-15 ENCOUNTER — Encounter (HOSPITAL_COMMUNITY): Payer: Self-pay

## 2018-12-15 ENCOUNTER — Other Ambulatory Visit: Payer: Self-pay

## 2018-12-15 DIAGNOSIS — Z01812 Encounter for preprocedural laboratory examination: Secondary | ICD-10-CM | POA: Insufficient documentation

## 2018-12-15 DIAGNOSIS — Z20828 Contact with and (suspected) exposure to other viral communicable diseases: Secondary | ICD-10-CM | POA: Diagnosis not present

## 2018-12-15 DIAGNOSIS — Z6841 Body Mass Index (BMI) 40.0 and over, adult: Secondary | ICD-10-CM | POA: Diagnosis not present

## 2018-12-15 HISTORY — DX: Gastro-esophageal reflux disease without esophagitis: K21.9

## 2018-12-15 HISTORY — DX: Family history of other specified conditions: Z84.89

## 2018-12-15 LAB — COMPREHENSIVE METABOLIC PANEL
ALT: 17 U/L (ref 0–44)
AST: 18 U/L (ref 15–41)
Albumin: 3.7 g/dL (ref 3.5–5.0)
Alkaline Phosphatase: 48 U/L (ref 38–126)
Anion gap: 10 (ref 5–15)
BUN: 14 mg/dL (ref 6–20)
CO2: 24 mmol/L (ref 22–32)
Calcium: 9.2 mg/dL (ref 8.9–10.3)
Chloride: 102 mmol/L (ref 98–111)
Creatinine, Ser: 0.73 mg/dL (ref 0.44–1.00)
GFR calc Af Amer: >60 mL/min (ref >60)
GFR calc non Af Amer: >60 mL/min (ref >60)
Glucose, Bld: 85 mg/dL (ref 70–99)
Potassium: 3.8 mmol/L (ref 3.5–5.1)
Sodium: 136 mmol/L (ref 135–145)
Total Bilirubin: 0.4 mg/dL (ref 0.3–1.2)
Total Protein: 7.9 g/dL (ref 6.5–8.1)

## 2018-12-15 LAB — CBC WITH DIFFERENTIAL/PLATELET
Abs Immature Granulocytes: 0.04 10*3/uL (ref 0.00–0.07)
Basophils Absolute: 0 10*3/uL (ref 0.0–0.1)
Basophils Relative: 0 %
Eosinophils Absolute: 0.2 10*3/uL (ref 0.0–0.5)
Eosinophils Relative: 2 %
HCT: 44.4 % (ref 36.0–46.0)
Hemoglobin: 13.5 g/dL (ref 12.0–15.0)
Immature Granulocytes: 0 %
Lymphocytes Relative: 22 %
Lymphs Abs: 2.2 10*3/uL (ref 0.7–4.0)
MCH: 28.4 pg (ref 26.0–34.0)
MCHC: 30.4 g/dL (ref 30.0–36.0)
MCV: 93.3 fL (ref 80.0–100.0)
Monocytes Absolute: 0.7 10*3/uL (ref 0.1–1.0)
Monocytes Relative: 7 %
Neutro Abs: 6.9 10*3/uL (ref 1.7–7.7)
Neutrophils Relative %: 69 %
Platelets: 323 10*3/uL (ref 150–400)
RBC: 4.76 MIL/uL (ref 3.87–5.11)
RDW: 13.3 % (ref 11.5–15.5)
WBC: 10 10*3/uL (ref 4.0–10.5)
nRBC: 0 % (ref 0.0–0.2)

## 2018-12-16 ENCOUNTER — Other Ambulatory Visit: Payer: Self-pay | Admitting: Internal Medicine

## 2018-12-16 LAB — NOVEL CORONAVIRUS, NAA (HOSP ORDER, SEND-OUT TO REF LAB; TAT 18-24 HRS): SARS-CoV-2, NAA: NOT DETECTED

## 2018-12-16 LAB — ABO/RH: ABO/RH(D): A POS

## 2018-12-18 MED ORDER — BUPIVACAINE LIPOSOME 1.3 % IJ SUSP
20.0000 mL | Freq: Once | INTRAMUSCULAR | Status: DC
  Filled 2018-12-18: qty 20

## 2018-12-19 ENCOUNTER — Other Ambulatory Visit: Payer: Self-pay

## 2018-12-19 ENCOUNTER — Encounter (HOSPITAL_COMMUNITY)
Admission: RE | Disposition: A | Discharge status: Home / Self Care | Payer: Self-pay | Source: Home / Self Care | Attending: General Surgery

## 2018-12-19 ENCOUNTER — Encounter (HOSPITAL_COMMUNITY): Payer: Self-pay

## 2018-12-19 ENCOUNTER — Inpatient Hospital Stay (HOSPITAL_COMMUNITY): Payer: 59 | Admitting: Physician Assistant

## 2018-12-19 ENCOUNTER — Inpatient Hospital Stay (HOSPITAL_COMMUNITY)
Admission: RE | Admit: 2018-12-19 | Discharge: 2018-12-20 | DRG: 621 | Disposition: A | Discharge status: Home / Self Care | Payer: 59 | Attending: General Surgery | Admitting: General Surgery

## 2018-12-19 ENCOUNTER — Inpatient Hospital Stay (HOSPITAL_COMMUNITY): Payer: 59 | Admitting: Anesthesiology

## 2018-12-19 DIAGNOSIS — Z6841 Body Mass Index (BMI) 40.0 and over, adult: Secondary | ICD-10-CM

## 2018-12-19 DIAGNOSIS — F329 Major depressive disorder, single episode, unspecified: Secondary | ICD-10-CM | POA: Diagnosis present

## 2018-12-19 DIAGNOSIS — G4733 Obstructive sleep apnea (adult) (pediatric): Secondary | ICD-10-CM | POA: Diagnosis present

## 2018-12-19 DIAGNOSIS — K449 Diaphragmatic hernia without obstruction or gangrene: Secondary | ICD-10-CM | POA: Diagnosis present

## 2018-12-19 DIAGNOSIS — J45909 Unspecified asthma, uncomplicated: Secondary | ICD-10-CM | POA: Diagnosis present

## 2018-12-19 DIAGNOSIS — M25561 Pain in right knee: Secondary | ICD-10-CM | POA: Diagnosis present

## 2018-12-19 DIAGNOSIS — M5442 Lumbago with sciatica, left side: Secondary | ICD-10-CM | POA: Diagnosis present

## 2018-12-19 DIAGNOSIS — Z8249 Family history of ischemic heart disease and other diseases of the circulatory system: Secondary | ICD-10-CM | POA: Diagnosis not present

## 2018-12-19 DIAGNOSIS — F411 Generalized anxiety disorder: Secondary | ICD-10-CM | POA: Diagnosis present

## 2018-12-19 DIAGNOSIS — G8929 Other chronic pain: Secondary | ICD-10-CM | POA: Diagnosis present

## 2018-12-19 DIAGNOSIS — Z9884 Bariatric surgery status: Secondary | ICD-10-CM

## 2018-12-19 DIAGNOSIS — I10 Essential (primary) hypertension: Secondary | ICD-10-CM | POA: Diagnosis present

## 2018-12-19 HISTORY — PX: LAPAROSCOPIC ROUX-EN-Y GASTRIC BYPASS WITH HIATAL HERNIA REPAIR: SHX6513

## 2018-12-19 LAB — TYPE AND SCREEN
ABO/RH(D): A POS
Antibody Screen: NEGATIVE

## 2018-12-19 LAB — HEMOGLOBIN AND HEMATOCRIT, BLOOD
HCT: 45.6 % (ref 36.0–46.0)
Hemoglobin: 14.3 g/dL (ref 12.0–15.0)

## 2018-12-19 LAB — PREGNANCY, URINE: Preg Test, Ur: NEGATIVE

## 2018-12-19 SURGERY — CREATION, GASTRIC BYPASS, LAPAROSCOPIC, USING ROUX-EN-Y GASTROENTEROSTOMY, WITH HIATAL HERNIA REPAIR
Anesthesia: General

## 2018-12-19 MED ORDER — ROCURONIUM BROMIDE 10 MG/ML (PF) SYRINGE
PREFILLED_SYRINGE | INTRAVENOUS | Status: AC
  Filled 2018-12-19: qty 10

## 2018-12-19 MED ORDER — ROCURONIUM BROMIDE 100 MG/10ML IV SOLN
INTRAVENOUS | Status: DC | PRN
  Administered 2018-12-19: 10 mg via INTRAVENOUS
  Administered 2018-12-19: 20 mg via INTRAVENOUS
  Administered 2018-12-19: 10 mg via INTRAVENOUS
  Administered 2018-12-19: 20 mg via INTRAVENOUS
  Administered 2018-12-19: 50 mg via INTRAVENOUS

## 2018-12-19 MED ORDER — OXYCODONE HCL 5 MG/5ML PO SOLN: 5.0000 mg | Freq: Once | ORAL | Status: DC | PRN

## 2018-12-19 MED ORDER — PROPOFOL 10 MG/ML IV BOLUS
INTRAVENOUS | Status: DC | PRN
  Administered 2018-12-19: 190 mg via INTRAVENOUS

## 2018-12-19 MED ORDER — SUCCINYLCHOLINE CHLORIDE 200 MG/10ML IV SOSY
PREFILLED_SYRINGE | INTRAVENOUS | Status: AC
  Filled 2018-12-19: qty 10

## 2018-12-19 MED ORDER — LIDOCAINE HCL 2 % IJ SOLN
INTRAMUSCULAR | Status: AC
  Filled 2018-12-19: qty 20

## 2018-12-19 MED ORDER — ONDANSETRON HCL 4 MG/2ML IJ SOLN
INTRAMUSCULAR | Status: AC
  Filled 2018-12-19: qty 2

## 2018-12-19 MED ORDER — SUCCINYLCHOLINE CHLORIDE 20 MG/ML IJ SOLN
INTRAMUSCULAR | Status: DC | PRN
  Administered 2018-12-19: 200 mg via INTRAVENOUS

## 2018-12-19 MED ORDER — PANTOPRAZOLE SODIUM 40 MG IV SOLR
40.0000 mg | Freq: Every day | INTRAVENOUS | Status: DC
  Administered 2018-12-19: 40 mg via INTRAVENOUS
  Filled 2018-12-19: qty 40

## 2018-12-19 MED ORDER — ESCITALOPRAM OXALATE 10 MG PO TABS
10.0000 mg | ORAL_TABLET | Freq: Every day | ORAL | Status: DC
  Administered 2018-12-20: 10 mg via ORAL
  Filled 2018-12-19: qty 1

## 2018-12-19 MED ORDER — PROPOFOL 10 MG/ML IV BOLUS
INTRAVENOUS | Status: AC
  Filled 2018-12-19: qty 20

## 2018-12-19 MED ORDER — PROMETHAZINE HCL 25 MG/ML IJ SOLN
12.5000 mg | Freq: Four times a day (QID) | INTRAMUSCULAR | Status: DC | PRN
  Administered 2018-12-19: 21:00:00 12.5 mg via INTRAVENOUS
  Filled 2018-12-19: qty 1

## 2018-12-19 MED ORDER — CHLORHEXIDINE GLUCONATE 4 % EX LIQD: 60.0000 mL | Freq: Once | CUTANEOUS | Status: DC

## 2018-12-19 MED ORDER — DIPHENHYDRAMINE HCL 50 MG/ML IJ SOLN: 12.5000 mg | Freq: Three times a day (TID) | INTRAMUSCULAR | Status: DC | PRN

## 2018-12-19 MED ORDER — GABAPENTIN 300 MG PO CAPS
300.0000 mg | ORAL_CAPSULE | ORAL | Status: AC
  Administered 2018-12-19: 07:00:00 300 mg via ORAL
  Filled 2018-12-19: qty 1

## 2018-12-19 MED ORDER — ACETAMINOPHEN 160 MG/5ML PO SOLN
1000.0000 mg | Freq: Three times a day (TID) | ORAL | Status: DC
  Administered 2018-12-20 (×2): 1000 mg via ORAL
  Filled 2018-12-19 (×3): qty 40.6

## 2018-12-19 MED ORDER — ENOXAPARIN SODIUM 30 MG/0.3ML ~~LOC~~ SOLN
30.0000 mg | Freq: Two times a day (BID) | SUBCUTANEOUS | Status: DC
  Administered 2018-12-19 – 2018-12-20 (×2): 30 mg via SUBCUTANEOUS
  Filled 2018-12-19 (×2): qty 0.3

## 2018-12-19 MED ORDER — SODIUM CHLORIDE (PF) 0.9 % IJ SOLN
INTRAMUSCULAR | Status: DC | PRN
  Administered 2018-12-19: 50 mL

## 2018-12-19 MED ORDER — ONDANSETRON HCL 4 MG/2ML IJ SOLN
4.0000 mg | Freq: Once | INTRAMUSCULAR | Status: AC | PRN
  Administered 2018-12-19: 4 mg via INTRAVENOUS

## 2018-12-19 MED ORDER — ENSURE MAX PROTEIN PO LIQD
2.0000 [oz_av] | ORAL | Status: DC
  Administered 2018-12-20 (×5): 2 [oz_av] via ORAL
  Filled 2018-12-19 (×9): qty 330

## 2018-12-19 MED ORDER — FENTANYL CITRATE (PF) 250 MCG/5ML IJ SOLN
INTRAMUSCULAR | Status: AC
  Filled 2018-12-19: qty 5

## 2018-12-19 MED ORDER — FENTANYL CITRATE (PF) 100 MCG/2ML IJ SOLN
INTRAMUSCULAR | Status: AC
  Filled 2018-12-19: qty 4

## 2018-12-19 MED ORDER — SIMETHICONE 80 MG PO CHEW
80.0000 mg | CHEWABLE_TABLET | Freq: Four times a day (QID) | ORAL | Status: DC | PRN
  Administered 2018-12-20 (×2): 80 mg via ORAL
  Filled 2018-12-19 (×2): qty 1

## 2018-12-19 MED ORDER — PHENYLEPHRINE HCL (PRESSORS) 10 MG/ML IV SOLN
INTRAVENOUS | Status: DC | PRN
  Administered 2018-12-19: 80 ug via INTRAVENOUS

## 2018-12-19 MED ORDER — "VISTASEAL 4 ML SINGLE DOSE KIT "
4.0000 mL | PACK | Freq: Once | CUTANEOUS | Status: AC
  Administered 2018-12-19: 4 mL via TOPICAL
  Filled 2018-12-19: qty 4

## 2018-12-19 MED ORDER — DEXAMETHASONE SODIUM PHOSPHATE 4 MG/ML IJ SOLN
4.0000 mg | INTRAMUSCULAR | Status: AC
  Administered 2018-12-19: 4 mg via INTRAVENOUS

## 2018-12-19 MED ORDER — OXYCODONE HCL 5 MG PO TABS: 5.0000 mg | ORAL_TABLET | Freq: Once | ORAL | Status: DC | PRN

## 2018-12-19 MED ORDER — MIDAZOLAM HCL 5 MG/5ML IJ SOLN
INTRAMUSCULAR | Status: DC | PRN
  Administered 2018-12-19 (×2): 1 mg via INTRAVENOUS

## 2018-12-19 MED ORDER — SUGAMMADEX SODIUM 500 MG/5ML IV SOLN
INTRAVENOUS | Status: AC
  Filled 2018-12-19: qty 5

## 2018-12-19 MED ORDER — SODIUM CHLORIDE 0.9 % IV SOLN
2.0000 g | INTRAVENOUS | Status: AC
  Administered 2018-12-19: 2 g via INTRAVENOUS
  Filled 2018-12-19: qty 2

## 2018-12-19 MED ORDER — ENALAPRILAT 1.25 MG/ML IV SOLN
1.2500 mg | Freq: Four times a day (QID) | INTRAVENOUS | Status: DC | PRN
  Administered 2018-12-19: 1.25 mg via INTRAVENOUS
  Filled 2018-12-19 (×3): qty 1

## 2018-12-19 MED ORDER — KETAMINE HCL 10 MG/ML IJ SOLN
INTRAMUSCULAR | Status: AC
  Filled 2018-12-19: qty 1

## 2018-12-19 MED ORDER — ENOXAPARIN SODIUM 40 MG/0.4ML ~~LOC~~ SOLN
40.0000 mg | SUBCUTANEOUS | Status: AC
  Administered 2018-12-19: 40 mg via SUBCUTANEOUS
  Filled 2018-12-19: qty 0.4

## 2018-12-19 MED ORDER — LACTATED RINGERS IV SOLN
INTRAVENOUS | Status: DC
  Administered 2018-12-19: 07:00:00 via INTRAVENOUS

## 2018-12-19 MED ORDER — BUPIVACAINE LIPOSOME 1.3 % IJ SUSP
INTRAMUSCULAR | Status: DC | PRN
  Administered 2018-12-19: 20 mL

## 2018-12-19 MED ORDER — LIDOCAINE 20MG/ML (2%) 15 ML SYRINGE OPTIME
INTRAMUSCULAR | Status: DC | PRN
  Administered 2018-12-19: 1.5 mg/kg/h via INTRAVENOUS

## 2018-12-19 MED ORDER — ONDANSETRON HCL 4 MG/2ML IJ SOLN
INTRAMUSCULAR | Status: DC | PRN
  Administered 2018-12-19: 4 mg via INTRAVENOUS

## 2018-12-19 MED ORDER — SODIUM CHLORIDE (PF) 0.9 % IJ SOLN
INTRAMUSCULAR | Status: AC
  Filled 2018-12-19: qty 50

## 2018-12-19 MED ORDER — SUGAMMADEX SODIUM 500 MG/5ML IV SOLN
INTRAVENOUS | Status: DC | PRN
  Administered 2018-12-19: 500 mg via INTRAVENOUS

## 2018-12-19 MED ORDER — LIDOCAINE 2% (20 MG/ML) 5 ML SYRINGE
INTRAMUSCULAR | Status: AC
  Filled 2018-12-19: qty 5

## 2018-12-19 MED ORDER — OXYCODONE HCL 5 MG/5ML PO SOLN
5.0000 mg | Freq: Four times a day (QID) | ORAL | Status: DC | PRN
  Administered 2018-12-20: 5 mg via ORAL
  Filled 2018-12-19: qty 5

## 2018-12-19 MED ORDER — MIDAZOLAM HCL 2 MG/2ML IJ SOLN
INTRAMUSCULAR | Status: AC
  Filled 2018-12-19: qty 2

## 2018-12-19 MED ORDER — ONDANSETRON HCL 4 MG/2ML IJ SOLN
4.0000 mg | Freq: Four times a day (QID) | INTRAMUSCULAR | Status: DC | PRN
  Administered 2018-12-19: 4 mg via INTRAVENOUS
  Filled 2018-12-19: qty 2

## 2018-12-19 MED ORDER — PHENYLEPHRINE 40 MCG/ML (10ML) SYRINGE FOR IV PUSH (FOR BLOOD PRESSURE SUPPORT)
PREFILLED_SYRINGE | INTRAVENOUS | Status: AC
  Filled 2018-12-19: qty 10

## 2018-12-19 MED ORDER — LIDOCAINE HCL (CARDIAC) PF 100 MG/5ML IV SOSY
PREFILLED_SYRINGE | INTRAVENOUS | Status: DC | PRN
  Administered 2018-12-19: 60 mg via INTRAVENOUS

## 2018-12-19 MED ORDER — ACETAMINOPHEN 500 MG PO TABS
1000.0000 mg | ORAL_TABLET | ORAL | Status: AC
  Administered 2018-12-19: 07:00:00 1000 mg via ORAL
  Filled 2018-12-19: qty 2

## 2018-12-19 MED ORDER — KETAMINE HCL 10 MG/ML IJ SOLN
INTRAMUSCULAR | Status: DC | PRN
  Administered 2018-12-19: 40 mg via INTRAVENOUS

## 2018-12-19 MED ORDER — LACTATED RINGERS IR SOLN
Status: DC | PRN
  Administered 2018-12-19: 1000 mL

## 2018-12-19 MED ORDER — PHENOL 1.4 % MT LIQD: 1.0000 | OROMUCOSAL | Status: DC | PRN

## 2018-12-19 MED ORDER — SCOPOLAMINE 1 MG/3DAYS TD PT72
1.0000 | MEDICATED_PATCH | TRANSDERMAL | Status: DC
  Administered 2018-12-19: 07:00:00 1.5 mg via TRANSDERMAL
  Filled 2018-12-19: qty 1

## 2018-12-19 MED ORDER — FENTANYL CITRATE (PF) 100 MCG/2ML IJ SOLN
25.0000 ug | INTRAMUSCULAR | Status: DC | PRN
  Administered 2018-12-19 (×3): 50 ug via INTRAVENOUS

## 2018-12-19 MED ORDER — DEXAMETHASONE SODIUM PHOSPHATE 10 MG/ML IJ SOLN
INTRAMUSCULAR | Status: AC
  Filled 2018-12-19: qty 1

## 2018-12-19 MED ORDER — ALBUTEROL SULFATE (2.5 MG/3ML) 0.083% IN NEBU: 2.5000 mg | INHALATION_SOLUTION | Freq: Four times a day (QID) | RESPIRATORY_TRACT | Status: DC | PRN

## 2018-12-19 MED ORDER — FENTANYL CITRATE (PF) 250 MCG/5ML IJ SOLN
INTRAMUSCULAR | Status: DC | PRN
  Administered 2018-12-19 (×2): 100 ug via INTRAVENOUS

## 2018-12-19 MED ORDER — GABAPENTIN 100 MG PO CAPS
200.0000 mg | ORAL_CAPSULE | Freq: Two times a day (BID) | ORAL | Status: DC
  Administered 2018-12-20: 200 mg via ORAL
  Filled 2018-12-19 (×2): qty 2

## 2018-12-19 MED ORDER — MORPHINE SULFATE (PF) 2 MG/ML IV SOLN
1.0000 mg | INTRAVENOUS | Status: DC | PRN
  Administered 2018-12-19 (×2): 2 mg via INTRAVENOUS
  Filled 2018-12-19: qty 1
  Filled 2018-12-19: qty 2
  Filled 2018-12-19: qty 1

## 2018-12-19 MED ORDER — APREPITANT 40 MG PO CAPS
40.0000 mg | ORAL_CAPSULE | ORAL | Status: AC
  Administered 2018-12-19: 07:00:00 40 mg via ORAL
  Filled 2018-12-19: qty 1

## 2018-12-19 MED ORDER — ACETAMINOPHEN 500 MG PO TABS
1000.0000 mg | ORAL_TABLET | Freq: Three times a day (TID) | ORAL | Status: DC
  Filled 2018-12-19 (×3): qty 2

## 2018-12-19 MED ORDER — KCL IN DEXTROSE-NACL 20-5-0.45 MEQ/L-%-% IV SOLN
INTRAVENOUS | Status: DC
  Administered 2018-12-19 – 2018-12-20 (×3): via INTRAVENOUS
  Filled 2018-12-19 (×5): qty 1000

## 2018-12-19 SURGICAL SUPPLY — 71 items
APPLICATOR COTTON TIP 6 STRL (MISCELLANEOUS) IMPLANT
APPLICATOR COTTON TIP 6IN STRL (MISCELLANEOUS)
APPLICATOR VISTASEAL 35 (MISCELLANEOUS) ×4 IMPLANT
APPLIER CLIP ROT 13.4 12 LRG (CLIP)
BENZOIN TINCTURE PRP APPL 2/3 (GAUZE/BANDAGES/DRESSINGS) ×2 IMPLANT
BLADE SURG SZ11 CARB STEEL (BLADE) ×2 IMPLANT
BNDG ADH 1X3 SHEER STRL LF (GAUZE/BANDAGES/DRESSINGS) ×2 IMPLANT
CABLE HIGH FREQUENCY MONO STRZ (ELECTRODE) IMPLANT
CHLORAPREP W/TINT 26 (MISCELLANEOUS) ×4 IMPLANT
CLIP APPLIE ROT 13.4 12 LRG (CLIP) IMPLANT
CLIP SUT LAPRA TY ABSORB (SUTURE) ×4 IMPLANT
CLSR STERI-STRIP ANTIMIC 1/2X4 (GAUZE/BANDAGES/DRESSINGS) ×2 IMPLANT
COVER WAND RF STERILE (DRAPES) IMPLANT
CUTTER FLEX LINEAR 45M (STAPLE) IMPLANT
DEVICE SUT QUICK LOAD TK 5 (STAPLE) ×2 IMPLANT
DEVICE SUT TI-KNOT TK 5X26 (MISCELLANEOUS) ×2 IMPLANT
DEVICE SUTURE ENDOST 10MM (ENDOMECHANICALS) ×2 IMPLANT
DRAIN PENROSE 18X1/4 LTX STRL (WOUND CARE) ×2 IMPLANT
ELECT REM PT RETURN 15FT ADLT (MISCELLANEOUS) ×2 IMPLANT
GAUZE 4X4 16PLY RFD (DISPOSABLE) ×2 IMPLANT
GAUZE SPONGE 4X4 12PLY STRL (GAUZE/BANDAGES/DRESSINGS) IMPLANT
GLOVE BIO SURGEON STRL SZ7.5 (GLOVE) IMPLANT
GLOVE INDICATOR 8.0 STRL GRN (GLOVE) ×2 IMPLANT
GOWN STRL REUS W/ TWL LRG LVL3 (GOWN DISPOSABLE) ×1 IMPLANT
GOWN STRL REUS W/TWL LRG LVL3 (GOWN DISPOSABLE) ×1
GOWN STRL REUS W/TWL XL LVL3 (GOWN DISPOSABLE) ×8 IMPLANT
HOVERMATT SINGLE USE (MISCELLANEOUS) ×2 IMPLANT
KIT BASIN OR (CUSTOM PROCEDURE TRAY) ×2 IMPLANT
KIT GASTRIC LAVAGE 34FR ADT (SET/KITS/TRAYS/PACK) ×2 IMPLANT
KIT TURNOVER KIT A (KITS) IMPLANT
MARKER SKIN DUAL TIP RULER LAB (MISCELLANEOUS) ×2 IMPLANT
NEEDLE SPNL 22GX3.5 QUINCKE BK (NEEDLE) ×2 IMPLANT
PACK CARDIOVASCULAR III (CUSTOM PROCEDURE TRAY) ×2 IMPLANT
PENCIL SMOKE EVACUATOR (MISCELLANEOUS) IMPLANT
RELOAD 45 VASCULAR/THIN (ENDOMECHANICALS) IMPLANT
RELOAD ENDO STITCH 2.0 (ENDOMECHANICALS) ×9
RELOAD STAPLE TA45 3.5 REG BLU (ENDOMECHANICALS) IMPLANT
RELOAD STAPLER BLUE 60MM (STAPLE) ×3 IMPLANT
RELOAD STAPLER GOLD 60MM (STAPLE) ×1 IMPLANT
RELOAD STAPLER WHITE 60MM (STAPLE) ×2 IMPLANT
SCISSORS LAP 5X45 EPIX DISP (ENDOMECHANICALS) ×2 IMPLANT
SET IRRIG TUBING LAPAROSCOPIC (IRRIGATION / IRRIGATOR) ×2 IMPLANT
SET TUBE SMOKE EVAC HIGH FLOW (TUBING) ×2 IMPLANT
SHEARS HARMONIC ACE PLUS 45CM (MISCELLANEOUS) ×2 IMPLANT
SLEEVE XCEL OPT CAN 5 100 (ENDOMECHANICALS) ×2 IMPLANT
SOL ANTI FOG 6CC (MISCELLANEOUS) ×1 IMPLANT
SOLUTION ANTI FOG 6CC (MISCELLANEOUS) ×1
STAPLER ECHELON BIOABSB 60 FLE (MISCELLANEOUS) IMPLANT
STAPLER ECHELON LONG 60 440 (INSTRUMENTS) ×2 IMPLANT
STAPLER RELOAD BLUE 60MM (STAPLE) ×6
STAPLER RELOAD GOLD 60MM (STAPLE) ×2
STAPLER RELOAD WHITE 60MM (STAPLE) ×4
STRIP CLOSURE SKIN 1/2X4 (GAUZE/BANDAGES/DRESSINGS) ×2 IMPLANT
SUT MNCRL AB 4-0 PS2 18 (SUTURE) ×2 IMPLANT
SUT RELOAD ENDO STITCH 2 48X1 (ENDOMECHANICALS) ×5
SUT RELOAD ENDO STITCH 2.0 (ENDOMECHANICALS) ×4
SUT SURGIDAC NAB ES-9 0 48 120 (SUTURE) ×2 IMPLANT
SUT VIC AB 2-0 SH 27 (SUTURE) ×1
SUT VIC AB 2-0 SH 27X BRD (SUTURE) ×1 IMPLANT
SUTURE RELOAD END STTCH 2 48X1 (ENDOMECHANICALS) ×5 IMPLANT
SUTURE RELOAD ENDO STITCH 2.0 (ENDOMECHANICALS) ×4 IMPLANT
SYR 10ML ECCENTRIC (SYRINGE) ×2 IMPLANT
SYR 20ML LL LF (SYRINGE) ×4 IMPLANT
TOWEL OR 17X26 10 PK STRL BLUE (TOWEL DISPOSABLE) ×2 IMPLANT
TOWEL OR NON WOVEN STRL DISP B (DISPOSABLE) ×2 IMPLANT
TRAY FOLEY MTR SLVR 16FR STAT (SET/KITS/TRAYS/PACK) IMPLANT
TROCAR BLADELESS OPT 5 100 (ENDOMECHANICALS) ×2 IMPLANT
TROCAR UNIVERSAL OPT 12M 100M (ENDOMECHANICALS) ×6 IMPLANT
TROCAR XCEL 12X100 BLDLESS (ENDOMECHANICALS) ×2 IMPLANT
TUBE CALIBRATION LAPBAND (TUBING) ×2 IMPLANT
TUBING CONNECTING 10 (TUBING) ×4 IMPLANT

## 2018-12-19 NOTE — Anesthesia Procedure Notes (Signed)
Procedure Name: Intubation Date/Time: 12/19/2018 7:33 AM Performed by: Glory Buff, CRNA Pre-anesthesia Checklist: Patient identified, Emergency Drugs available, Suction available and Patient being monitored Patient Re-evaluated:Patient Re-evaluated prior to induction Oxygen Delivery Method: Circle system utilized Preoxygenation: Pre-oxygenation with 100% oxygen Induction Type: IV induction Ventilation: Mask ventilation without difficulty Laryngoscope Size: Miller and 3 Grade View: Grade I Tube type: Oral Tube size: 7.0 mm Number of attempts: 1 Airway Equipment and Method: Stylet and Oral airway Placement Confirmation: ETT inserted through vocal cords under direct vision,  positive ETCO2 and breath sounds checked- equal and bilateral Secured at: 21 cm Tube secured with: Tape Dental Injury: Teeth and Oropharynx as per pre-operative assessment

## 2018-12-19 NOTE — Progress Notes (Addendum)
Patient shows understanding of incentive spirometer reaching up to 1750. Patient has voided and walked to bathroom but still complains of some dizziness. Will continue to monitor progress.   Patient walked length of hall returned to chair and drank 2cc of water at 1600.

## 2018-12-19 NOTE — Op Note (Signed)
Preoperative diagnosis: Roux-en-Y gastric bypass  Postoperative diagnosis: Same   Procedure: Upper endoscopy   Surgeon: Clovis Riley, M.D.  Anesthesia: Gen.   Description of procedure: The endoscopy was placed in the mouth and into the oropharynx and under endoscopic vision it was advanced to the esophagogastric junction which was identified at 39 from the teeth. With the roux limb occluded by Dr. Redmond Pulling the pouch was tensely insufflated while the upper abdomen was flooded with irrigation to perform a leak test, which was negative. No bubbles were seen.  The staple line was hemostatic and anastomosis is visibly patent. The pouch measures 5cm in length and is tubular in shape with no retained fundus. The lumen was decompressed and the scope was withdrawn without difficulty.    Clovis Riley, M.D. General, Bariatric, & Minimally Invasive Surgery [facility]

## 2018-12-19 NOTE — Progress Notes (Signed)
Discussed post op day goals with patient including ambulation, IS, diet progression, pain, and nausea control.  BSTOP education provided including BSTOP information guide, "Guide for Pain Management after your Bariatric Procedure".  Questions answered. 

## 2018-12-19 NOTE — Discharge Instructions (Signed)
° ° ° °GASTRIC BYPASS/SLEEVE ° Home Care Instructions ° ° These instructions are to help you care for yourself when you go home. ° °Call: If you have any problems. °• Call [phone] and ask for the surgeon on call °• If you need immediate help, come to the ER at [place].  °• Tell the ER staff that you are a new post-op gastric bypass or gastric sleeve patient °  °Signs and symptoms to report: • Severe vomiting or nausea °o If you cannot keep down clear liquids for longer than 1 day, call your surgeon  °• Abdominal pain that does not get better after taking your pain medication °• Fever over 100.4° F with chills °• Heart beating over 100 beats a minute °• Shortness of breath at rest °• Chest pain °•  Redness, swelling, drainage, or foul odor at incision (surgical) sites °•  If your incisions open or pull apart °• Swelling or pain in calf (lower leg) °• Diarrhea (Loose bowel movements that happen often), frequent watery, uncontrolled bowel movements °• Constipation, (no bowel movements for 3 days) if this happens: Pick one °o Milk of Magnesia, 2 tablespoons by mouth, 3 times a day for 2 days if needed °o Stop taking Milk of Magnesia once you have a bowel movement °o Call your doctor if constipation continues °Or °o Miralax  (instead of Milk of Magnesia) following the label instructions °o Stop taking Miralax once you have a bowel movement °o Call your doctor if constipation continues °• Anything you think is not normal °  °Normal side effects after surgery: • Unable to sleep at night or unable to focus °• Irritability or moody °• Being tearful (crying) or depressed °These are common complaints, possibly related to your anesthesia medications that put you to sleep, stress of surgery, and change in lifestyle.  This usually goes away a few weeks after surgery.  If these feelings continue, call your primary care doctor. °  °Wound Care: You may have surgical glue, steri-strips, or staples over your incisions after  surgery °• Surgical glue:  Looks like a clear film over your incisions and will wear off a little at a time °• Steri-strips: Strips of tape over your incisions. You may notice a yellowish color on the skin under the steri-strips. This is used to make the   steri-strips stick better. Do not pull the steri-strips off - let them fall off °• Staples: Staples may be removed before you leave the hospital °o If you go home with staples, call [facility], [phone] at for an appointment with your surgeon’s nurse to have staples removed 10 days after surgery. °• Showering: You may shower two (2) days after your surgery unless your surgeon tells you differently °o Wash gently around incisions with warm soapy water, rinse well, and gently pat dry  °o No tub baths until staples are removed, steri-strips fall off or glue is gone.  °  °Medications: • Medications should be liquid or crushed if larger than the size of a dime °• Extended release pills (medication that release a little bit at a time through the day) should NOT be crushed or cut. (examples include XL, ER, DR, SR) °• Depending on the size and number of medications you take, you may need to space (take a few throughout the day)/change the time you take your medications so that you do not over-fill your pouch (smaller stomach) °• Make sure you follow-up with your primary care doctor to   make medication changes needed during rapid weight loss and life-style changes °• If you have diabetes, follow up with the doctor that orders your diabetes medication(s) within one week after surgery and check your blood sugar regularly. °• Do not drive while taking prescription pain medication  °• It is ok to take Tylenol by the bottle instructions with your pain medicine or instead of your pain medicine as needed.  DO NOT TAKE NSAIDS (EXAMPLES OF NSAIDS:  IBUPROFREN/ NAPROXEN)  °Diet:                    First 2 Weeks ° You will see the dietician t about two (2) weeks  after your surgery. The dietician will increase the types of foods you can eat if you are handling liquids well: °• If you have severe vomiting or nausea and cannot keep down clear liquids lasting longer than 1 day, call your surgeon @ ([phone]) °Protein Shake °• Drink at least 2 ounces of shake 5-6 times per day °• Each serving of protein shakes (usually 8 - 12 ounces) should have: °o 15 grams of protein  °o And no more than 5 grams of carbohydrate  °• Goal for protein each day: °o Men = 80 grams per day °o Women = 60 grams per day °• Protein powder may be added to fluids such as non-fat milk or Lactaid milk or unsweetened Soy/Almond milk (limit to 35 grams added protein powder per serving) ° °Hydration °• Slowly increase the amount of water and other clear liquids as tolerated (See Acceptable Fluids) °• Slowly increase the amount of protein shake as tolerated  °•  Sip fluids slowly and throughout the day.  Do not use straws. °• May use sugar substitutes in small amounts (no more than 6 - 8 packets per day; i.e. Splenda) ° °Fluid Goal °• The first goal is to drink at least 8 ounces of protein shake/drink per day (or as directed by the nutritionist); some examples of protein shakes are Syntrax Nectar, Adkins Advantage, EAS Edge HP, and Unjury. See handout from pre-op Bariatric Education Class: °o Slowly increase the amount of protein shake you drink as tolerated °o You may find it easier to slowly sip shakes throughout the day °o It is important to get your proteins in first °• Your fluid goal is to drink 64 - 100 ounces of fluid daily °o It may take a few weeks to build up to this °• 32 oz (or more) should be clear liquids  °And  °• 32 oz (or more) should be full liquids (see below for examples) °• Liquids should not contain sugar, caffeine, or carbonation ° °Clear Liquids: °• Water or Sugar-free flavored water (i.e. Fruit H2O, Propel) °• Decaffeinated coffee or tea (sugar-free) °• Crystal Lite, Wyler’s Lite,  Minute Maid Lite °• Sugar-free Jell-O °• Bouillon or broth °• Sugar-free Popsicle:   *Less than 20 calories each; Limit 1 per day ° °Full Liquids: °Protein Shakes/Drinks + 2 choices per day of other full liquids °• Full liquids must be: °o No More Than 15 grams of Carbs per serving  °o No More Than 3 grams of Fat per serving °• Strained low-fat cream soup (except Cream of Potato or Tomato) °• Non-Fat milk °• Fat-free Lactaid Milk °• Unsweetened Soy Or Unsweetened Almond Milk °• Low Sugar yogurt (Dannon Lite & Fit, Greek yogurt; Oikos Triple Zero; Chobani Simply 100; Yoplait 100 calorie Greek - No Fruit on the Bottom) ° °  °Vitamins   and Minerals • Start 1 day after surgery unless otherwise directed by your surgeon °• 2 Chewable Bariatric Specific Multivitamin / Multimineral Supplement with iron (Example: Bariatric Advantage Multi EA) °• Chewable Calcium with Vitamin D-3 °(Example: 3 Chewable Calcium Plus 600 with Vitamin D-3) °o Take 500 mg three (3) times a day for a total of 1500 mg each day °o Do not take all 3 doses of calcium at one time as it may cause constipation, and you can only absorb 500 mg  at a time  °o Do not mix multivitamins containing iron with calcium supplements; take 2 hours apart °• Menstruating women and those with a history of anemia (a blood disease that causes weakness) may need extra iron °o Talk with your doctor to see if you need more iron °• Do not stop taking or change any vitamins or minerals until you talk to your dietitian or surgeon °• Your Dietitian and/or surgeon must approve all vitamin and mineral supplements °  °Activity and Exercise: Limit your physical activity as instructed by your doctor.  It is important to continue walking at home.  During this time, use these guidelines: °• Do not lift anything greater than ten (10) pounds for at least two (2) weeks °• Do not go back to work or drive until your surgeon says you can °• You may have sex when you feel comfortable  °o It is  VERY important for female patients to use a reliable birth control method; fertility often increases after surgery  °o All hormonal birth control will be ineffective for 30 days after surgery due to medications given during surgery a barrier method must be used. °o Do not get pregnant for at least 18 months °• Start exercising as soon as your doctor tells you that you can °o Make sure your doctor approves any physical activity °• Start with a simple walking program °• Walk 5-15 minutes each day, 7 days per week.  °• Slowly increase until you are walking 30-45 minutes per day °Consider joining our BELT program. [phone] or email belt@[employer] °  °Special Instructions Things to remember: °• Use your CPAP when sleeping if this applies to you ° °• [facility] has two free Bariatric Surgery Support Groups that meet monthly °o The 3rd Thursday of each month, 6 pm, [facility]  °o The 2nd Friday of each month, 11:45 am in the private dining room in the basement of [facility] °• It is very important to keep all follow up appointments with your surgeon, dietitian, primary care physician, and behavioral health practitioner °• Routine follow up schedule with your surgeon include appointments at 2-3 weeks, 6-8 weeks, 6 months, and 1 year at a minimum.  Your surgeon may request to see you more often.   °o After the first year, please follow up with your bariatric surgeon and dietitian at least once a year in order to maintain best weight loss results °[facility]: [phone] °[facility]: [phone] °Bariatric Nurse Coordinator: [phone] °  °   Reviewed and Endorsed  °by [place] Patient Education Committee, June, 2016 °Edits Approved: Aug, 2018 ° ° ° °

## 2018-12-19 NOTE — Transfer of Care (Signed)
Immediate Anesthesia Transfer of Care Note  Patient: Robyn Jennings  Procedure(s) Performed: LAPAROSCOPIC ROUX-EN-Y GASTRIC BYPASS WITH HIATAL HERNIA REPAIR, Upper Endo-ERAS Pathway (N/A )  Patient Location: PACU  Anesthesia Type:General  Level of Consciousness: drowsy, patient cooperative and responds to stimulation  Airway & Oxygen Therapy: Patient Spontanous Breathing and Patient connected to face mask oxygen  Post-op Assessment: Report given to RN and Post -op Vital signs reviewed and stable  Post vital signs: Reviewed and stable  Last Vitals:  Vitals Value Taken Time  BP 149/84 12/19/18 1012  Temp    Pulse 88 12/19/18 1013  Resp 12 12/19/18 1013  SpO2 100 % 12/19/18 1013  Vitals shown include unvalidated device data.  Last Pain:  Vitals:   12/19/18 8003  TempSrc:   PainSc: 0-No pain         Complications: No apparent anesthesia complications

## 2018-12-19 NOTE — Anesthesia Postprocedure Evaluation (Signed)
Anesthesia Post Note  Patient: Robyn Jennings  Procedure(s) Performed: LAPAROSCOPIC ROUX-EN-Y GASTRIC BYPASS WITH HIATAL HERNIA REPAIR, Upper Endo-ERAS Pathway (N/A )     Anesthesia Post Evaluation  Last Vitals:  Vitals:   12/19/18 1325 12/19/18 1422  BP: (!) 159/98 (!) 166/105  Pulse: 75 79  Resp:    Temp: 36.8 C 36.7 C  SpO2: 96% 98%    Last Pain:  Vitals:   12/19/18 1422  TempSrc: Oral  PainSc:                  Valetta Mulroy COKER

## 2018-12-19 NOTE — Anesthesia Preprocedure Evaluation (Addendum)
Anesthesia Evaluation  Patient identified by MRN, date of birth, ID band Patient awake    Reviewed: Allergy & Precautions, NPO status , Patient's Chart, lab work & pertinent test results  Airway Mallampati: II  TM Distance: >3 FB Neck ROM: Full    Dental  (+) Dental Advisory Given   Pulmonary    breath sounds clear to auscultation       Cardiovascular  Rhythm:Regular Rate:Normal     Neuro/Psych    GI/Hepatic   Endo/Other    Renal/GU      Musculoskeletal   Abdominal (+) + obese,   Peds  Hematology   Anesthesia Other Findings   Reproductive/Obstetrics                             Anesthesia Physical Anesthesia Plan  ASA: III  Anesthesia Plan: General   Post-op Pain Management:    Induction: Intravenous  PONV Risk Score and Plan: Ondansetron, Dexamethasone and Scopolamine patch - Pre-op  Airway Management Planned: Oral ETT  Additional Equipment:   Intra-op Plan:   Post-operative Plan: Extubation in OR  Informed Consent: I have reviewed the patients History and Physical, chart, labs and discussed the procedure including the risks, benefits and alternatives for the proposed anesthesia with the patient or authorized representative who has indicated his/her understanding and acceptance.     Dental advisory given  Plan Discussed with: CRNA and Anesthesiologist  Anesthesia Plan Comments:         Anesthesia Quick Evaluation

## 2018-12-19 NOTE — Progress Notes (Signed)
Pt refused CPAP qhs.  Pt states she does not wear cpap at home and does not want to wear it while in the hospital.  Pt encouraged to contact RT should she change her mind.

## 2018-12-19 NOTE — Interval H&P Note (Signed)
History and Physical Interval Note:  12/19/2018 7:12 AM  Robyn Jennings  has presented today for surgery, with the diagnosis of Morbid Obesity, OSA, HTN, Knee Pain, Joint Pain, Anxiety.  The various methods of treatment have been discussed with the patient and family. After consideration of risks, benefits and other options for treatment, the patient has consented to  Procedure(s): LAPAROSCOPIC ROUX-EN-Y GASTRIC BYPASS WITH HIATAL HERNIA REPAIR, Upper Endo-ERAS Pathway (N/A) as a surgical intervention.  The patient's history has been reviewed, patient examined, no change in status, stable for surgery.  I have reviewed the patient's chart and labs.  Questions were answered to the patient's satisfaction.    [name]. Redmond Pulling, MD, FACS General, Bariatric, & Minimally Invasive Surgery [facility]  [name]

## 2018-12-19 NOTE — Progress Notes (Signed)
PHARMACY CONSULT FOR:  Risk Assessment for Post-Discharge VTE Following Bariatric Surgery  Post-Discharge VTE Risk Assessment: This patient's probability of 30-day post-discharge VTE is increased due to the factors marked:   Female    Age >/=60 years  X  BMI >/=50 kg/m2    CHF    Dyspnea at Rest    Paraplegia  X  Non-gastric-band surgery    Operation Time >/=3 hr    Return to OR     Length of Stay >/= 3 d      Hx of VTE   Hypercoagulable condition   Significant venous stasis   Predicted probability of 30-day post-discharge VTE: 0.27%  Other patient-specific factors to consider: none   Recommendation for Discharge: No pharmacologic prophylaxis post-discharge  Robyn Jennings is a 38 y.o. female who underwent laparoscopic Roux-en-Y gastric bypass 12/19/2018   Allergies  Allergen Reactions  . Other Anaphylaxis    Nuts--All Types  . Bupropion Other (See Comments)    Made pt feel lethargic   . Cetirizine Palpitations  . Venlafaxine Palpitations    Patient Measurements: Height: 5\' 7"  (170.2 cm) Weight: (!) 432 lb 8 oz (196.2 kg) IBW/kg (Calculated) : 61.6 Body mass index is 67.74 kg/m.  No results for input(s): WBC, HGB, HCT, PLT, APTT, CREATININE, LABCREA, CREATININE, CREAT24HRUR, MG, PHOS, ALBUMIN, PROT, ALBUMIN, AST, ALT, ALKPHOS, BILITOT, BILIDIR, IBILI in the last 72 hours. Estimated Creatinine Clearance: 177.1 mL/min (by C-G formula based on SCr of 0.73 mg/dL).    Past Medical History:  Diagnosis Date  . Asthma    seasonal well controlled  . Family history of adverse reaction to anesthesia    grandmother took days to "wake up"  . GERD (gastroesophageal reflux disease)   . Major depression in partial remission ([place])   . Morbid obesity ([place])   . Nasal polyposis   . Obstructive sleep apnea    no C-Pap     Medications Prior to Admission  Medication Sig Dispense Refill Last Dose  . albuterol (VENTOLIN HFA) 108 (90 Base) MCG/ACT inhaler INHALE 1 PUFF INTO THE  LUNGS 4 (FOUR) TIMES DAILY AS NEEDED. 18 g 1 12/19/2018 at Unknown time  . ALPRAZolam (XANAX) 0.25 MG tablet Take 0.25 mg by mouth 2 (two) times daily as needed for anxiety.   Past Month at Unknown time  . cholecalciferol (VITAMIN D3) 25 MCG (1000 UT) tablet Take 1,000 Units by mouth daily.   Past Week at Unknown time  . escitalopram (LEXAPRO) 20 MG tablet TAKE 1/2 TABLET BY MOUTH ONCE PER DAY. (Patient taking differently: Take 10 mg by mouth daily. ) 45 tablet 3 12/18/2018 at Unknown time  . fluticasone (FLONASE) 50 MCG/ACT nasal spray Place 2 sprays into both nostrils daily. In each nostril (Patient taking differently: Place 2 sprays into both nostrils daily as needed for allergies. In each nostril) 16 g 12 Past Month at Unknown time  . Multiple Vitamins-Minerals (WOMENS MULTIVITAMIN) TABS Take 1 tablet by mouth daily.    Past Week at Unknown time  . montelukast (SINGULAIR) 10 MG tablet Take 1 tablet (10 mg total) by mouth at bedtime. (Patient taking differently: Take 10 mg by mouth at bedtime as needed (allergies). ) 90 tablet 3 More than a month at Unknown time    Eudelia Bunch, Pharm.D [phone] 12/19/2018 12:27 PM

## 2018-12-19 NOTE — Op Note (Signed)
Santrice Muzio [id number] 1980/09/01. 12/19/2018  Preoperative diagnosis:    Severe obesity BMI 68   Obstructive sleep apnea   Chronic pain of right knee   Chronic low back pain with left-sided sciatica  Postoperative  diagnosis:  1. Same + small sliding hiatal hernia  Surgical procedure: Laparoscopic Roux-en-Y gastric bypass (ante-colic, ante-gastric) with hiatal hernia repair; upper endoscopy  Surgeon: Atilano Ina, M.D. FACS  Asst.: Phylliss Blakes MD FACS  Anesthesia: General plus exparel/marcaine mix  Complications: None   EBL: Minimal   Drains: None   Disposition: PACU in good condition   Indications for procedure: 38 y.o. yo female with morbid obesity who has been unsuccessful at sustained weight loss. The patient's comorbidities are listed above. We discussed the risk and benefits of surgery including but not limited to anesthesia risk, bleeding, infection, blood clot formation, anastomotic leak, anastomotic stricture, ulcer formation, death, respiratory complications, intestinal blockage, internal hernia, gallstone formation, vitamin and nutritional deficiencies, injury to surrounding structures, failure to lose weight and mood changes. She lost about 10kg as part of her preop liver shrinking diet.   Description of procedure: Patient is brought to the operating room and general anesthesia induced. ERAS protocol used. The patient had received preoperative broad-spectrum IV antibiotics and subcutaneous heparin  Along with oral tylenol and gabapentin. The abdomen was widely sterilely prepped with Chloraprep and draped. Patient timeout was performed and correct patient and procedure confirmed. Access was obtained with a 12 mm Optiview trocar in the left upper quadrant and pneumoperitoneum established without difficulty. Under direct vision 12 mm trocars were placed laterally in the right upper quadrant, right upper quadrant midclavicular line, and to the left and above the umbilicus for  the camera port. A 5 mm trocar was placed laterally in the left upper quadrant.  Exparel/marcaine mix was infiltrated in bilateral lateral abdominal walls as a TAP block.  The omentum was brought into the upper abdomen and the transverse mesocolon elevated and the ligament of Treitz clearly identified. There was a fair amount of torque in the abdominal wall - her abdominal wall was fairly thick. A 40 cm biliopancreatic limb was then carefully measured from the ligament of Treitz. The small intestine was divided at this point with a single firing of the white load linear stapler. A Penrose drain was sutured to the end of the Roux-en-Y limb for later identification. A 100 cm Roux-en-Y limb was then carefully measured. At this point a side-to-side anastomosis was created between the Roux limb and the end of the biliopancreatic limb. This was accomplished with a single firing of the 60 mm white load linear stapler. The common enterotomy was closed with a running 2-0 Vicryl begun at either end of the enterotomy and tied centrally. Vistaseal tissue sealant was placed over the anastomosis. The mesenteric defect was then closed with running 2-0 silk. The omentum was then divided with the harmonic scalpel up towards the transverse colon to allow mobility of the Roux limb toward the gastric pouch. The patient was then placed in steep reversed Trendelenburg. Through a 5 mm subxiphoid site the [facility] retractor was placed and the left lobe of the liver elevated with excellent exposure of the upper stomach and hiatus.   There appeared to be a dimple in the hiatus concerning for small hiatal hernia.  A calibration tube was placed down the oropharynx into the stomach.  The balloon was inflated with 10 cc of air and the anesthetist pulled back on the inflated tube and it  slid up into the mediastinum.  This confirmed my suspicion for a small sliding hiatal hernia.  The tube was deflated and kept in the esophagus.  The  gastrohepatic ligament was incised with harmonic scalpel.  The right crus of the diaphragm was identified.  The peritoneum was gently incised with harmonic scalpel.  Then using blunt dissection I identified the left crus of the diaphragm.  There was a small gap between the left and right crura.  The esophagus and posterior vagus nerve was identified and protected.  Using a 0 Ethibond Endo Stitch I reapproximated the left and right crura with 1 suture and secured it with a titanium tie knot.  We then had the anesthetist readvanced the calibration tube back down into the stomach.  It was reinflated with 10 cc of air and this time when pulling back it stayed within the abdomen and did not go up into the mediastinum.  The tube was deflated and removed from the patient.   The angle of Hiss was then mobilized with the harmonic scalpel. A 4 -5 cm gastric pouch was then carefully measured along the lesser curve of the stomach. Dissection was carried along the lesser curve at this point with the Harmonic scalpel working carefully back toward the lesser sac at right angles to the lesser curve. The free lesser sac was then entered. After being sure all tubes were removed from the stomach an initial firing of the gold load 60 mm linear stapler was fired at right angles across the lesser curve for about 4 cm. The gastric pouch was further mobilized posteriorly and then the pouch was completed with 3 further firings of the 60 mm blue load linear stapler up through the previously dissected angle of His. It was ensured that the pouch was completely mobilized away from the gastric remnant. This created a nice tubular 4-5 cm gastric pouch. The Roux limb was then brought up in an antecolic fashion with the candycane facing to the patient's left without undue tension. The gastrojejunostomy was created with an initial posterior row of 2-0 Vicryl between the Roux limb and the staple line of the gastric pouch. Enterotomies were then  made in the gastric pouch and the Roux limb with the harmonic scalpel and at approximately 2-2-1/2 cm anastomosis was created with a single firing of the 62mm blue load linear stapler. The staple line was inspected and was intact without bleeding. The common enterotomy was then closed with running 2-0 Vicryl begun at either end and tied centrally. The Ewall tube was then easily passed through the anastomosis and an outer anterior layer of running 2-0 Vicryl was placed. The Ewald tube was removed. With the outlet of the gastrojejunostomy clamped and under saline irrigation the assistant performed upper endoscopy and with the gastric pouch tensely distended with air-there was no evidence of leak on this test. The pouch was desufflated. The Terance Hart defect was closed with running 2-0 silk. The abdomen was inspected for any evidence of bleeding or bowel injury and everything looked fine. The Nathanson retractor was removed under direct vision after coating the anastomosis with Vistaseal tissue sealant. All CO2 was evacuated and trochars removed. Skin incisions were closed with 4-0 monocryl in a subcuticular fashion followed by benzoin, steri-strips and bandages. Sponge needle and instrument counts were correct. The patient was taken to the PACU in good condition.    [name]. Redmond Pulling, MD, FACS General, Bariatric, & Minimally Invasive Surgery [facility], [place]

## 2018-12-20 ENCOUNTER — Encounter (HOSPITAL_COMMUNITY): Payer: Self-pay | Admitting: General Surgery

## 2018-12-20 LAB — CBC WITH DIFFERENTIAL/PLATELET
Abs Immature Granulocytes: 0.04 10*3/uL (ref 0.00–0.07)
Basophils Absolute: 0 10*3/uL (ref 0.0–0.1)
Basophils Relative: 0 %
Eosinophils Absolute: 0 10*3/uL (ref 0.0–0.5)
Eosinophils Relative: 0 %
HCT: 42.6 % (ref 36.0–46.0)
Hemoglobin: 13.3 g/dL (ref 12.0–15.0)
Immature Granulocytes: 0 %
Lymphocytes Relative: 8 %
Lymphs Abs: 0.9 10*3/uL (ref 0.7–4.0)
MCH: 29.2 pg (ref 26.0–34.0)
MCHC: 31.2 g/dL (ref 30.0–36.0)
MCV: 93.4 fL (ref 80.0–100.0)
Monocytes Absolute: 1 10*3/uL (ref 0.1–1.0)
Monocytes Relative: 9 %
Neutro Abs: 9.6 10*3/uL — ABNORMAL HIGH (ref 1.7–7.7)
Neutrophils Relative %: 83 %
Platelets: 298 10*3/uL (ref 150–400)
RBC: 4.56 MIL/uL (ref 3.87–5.11)
RDW: 13.9 % (ref 11.5–15.5)
WBC: 11.5 10*3/uL — ABNORMAL HIGH (ref 4.0–10.5)
nRBC: 0 % (ref 0.0–0.2)

## 2018-12-20 LAB — COMPREHENSIVE METABOLIC PANEL
ALT: 28 U/L (ref 0–44)
AST: 27 U/L (ref 15–41)
Albumin: 3.5 g/dL (ref 3.5–5.0)
Alkaline Phosphatase: 41 U/L (ref 38–126)
Anion gap: 8 (ref 5–15)
BUN: 6 mg/dL (ref 6–20)
CO2: 25 mmol/L (ref 22–32)
Calcium: 8.8 mg/dL — ABNORMAL LOW (ref 8.9–10.3)
Chloride: 102 mmol/L (ref 98–111)
Creatinine, Ser: 0.75 mg/dL (ref 0.44–1.00)
GFR calc Af Amer: >60 mL/min (ref >60)
GFR calc non Af Amer: >60 mL/min (ref >60)
Glucose, Bld: 125 mg/dL — ABNORMAL HIGH (ref 70–99)
Potassium: 4.2 mmol/L (ref 3.5–5.1)
Sodium: 135 mmol/L (ref 135–145)
Total Bilirubin: 0.7 mg/dL (ref 0.3–1.2)
Total Protein: 7.1 g/dL (ref 6.5–8.1)

## 2018-12-20 MED ORDER — PANTOPRAZOLE SODIUM 40 MG PO TBEC: 40.0000 mg | DELAYED_RELEASE_TABLET | Freq: Every day | ORAL | 0 refills | Status: DC

## 2018-12-20 MED ORDER — TRAMADOL HCL 50 MG PO TABS: 50.0000 mg | ORAL_TABLET | Freq: Four times a day (QID) | ORAL | 0 refills | Status: DC | PRN

## 2018-12-20 MED ORDER — GABAPENTIN 100 MG PO CAPS: 200.0000 mg | ORAL_CAPSULE | Freq: Two times a day (BID) | ORAL | 0 refills | Status: DC

## 2018-12-20 MED ORDER — ACETAMINOPHEN 500 MG PO TABS: 1000.0000 mg | ORAL_TABLET | Freq: Three times a day (TID) | ORAL | 0 refills | Status: AC

## 2018-12-20 MED ORDER — ONDANSETRON 4 MG PO TBDP: 4.0000 mg | ORAL_TABLET | Freq: Four times a day (QID) | ORAL | 0 refills | Status: DC | PRN

## 2018-12-20 NOTE — Progress Notes (Signed)
Nutrition Note  RD consulted for diet education for patient s/p bariatric surgery. Bariatric nurse coordinator providing education at this time.  If nutrition issues arise, please consult RD.   Dodge Ator, MS, RD, LDN Inpatient Clinical Dietitian Pager: [phone] After Hours Pager: [phone]  

## 2018-12-20 NOTE — Progress Notes (Signed)
Patient alert and oriented, pain is controlled. Patient is tolerating fluids, advanced to protein shake today, patient is tolerating well. Reviewed Gastric Bypass discharge instructions with patient and patient is able to articulate understanding. Provided information on BELT program, Support Group and [facility] outpatient pharmacy. All questions answered, will continue to monitor.   Total fluid intake 540

## 2018-12-20 NOTE — Plan of Care (Signed)
Pt was discharged home today. Instructions were reviewed with patient, and questions were answered. Pt was taken to main entrance via wheelchair by NT.  

## 2018-12-21 NOTE — Discharge Summary (Signed)
Physician Discharge Summary  Robyn Jennings ENI:[id number] DOB: Jan 14, 1980 DOA: 12/19/2018  PCP: [name], MD  Admit date: 12/19/2018 Discharge date: 12/20/2018  Recommendations for Outpatient Follow-up:    Follow-up Information    Robyn Adu, MD. Go on 01/10/2019.   Specialty: General Surgery Why: at 1015 Contact information: [address] [place] [address] [phone]        [name], PA-C. Go on 02/15/2019.   Specialty: General Surgery Why: at 930 Contact information: [address] [place] [address] [phone]          Discharge Diagnoses:  Principal Problem:   Severe obesity (HCC) Active Problems:   Obstructive sleep apnea   Chronic pain of right knee   Chronic low back pain with left-sided sciatica   Gastric bypass status for obesity   Surgical Procedure: Laparoscopic Roux-en-Y gastric bypass, upper endoscopy  Discharge Condition: Good Disposition: Home  Diet recommendation: Postoperative gastric bypass diet  Filed Weights   12/19/18 0539  Weight: (!) 196.2 kg     Hospital Course:  The patient was admitted for a planned laparoscopic Roux-en-Y gastric bypass. Please see operative note. Preoperatively the patient was given 5000 units of subcutaneous heparin for DVT prophylaxis. ERAS protocol was used. Postoperative prophylactic Lovenox dosing was started on the evening of postoperative day 0.  The patient was started on ice chips and water on the evening of POD 0 which they tolerated. On postoperative day 1 The patient's diet was advanced to protein shakes which they also tolerated. On POD 2, The patient was ambulating without difficulty. Their vital signs are stable without fever or tachycardia. Their hemoglobin had remained stable. The patient declined CPAP therapy. The patient had received discharge instructions and counseling. They were deemed stable for discharge.  BP (!) 147/97 (BP Location: Left Arm)   Pulse 70    Temp 98 F (36.7 C) (Oral)   Resp 16   Ht 5\' 7"  (1.702 m)   Wt (!) 196.2 kg   LMP 11/21/2018   SpO2 96%   BMI 67.74 kg/m   Gen: alert, NAD, non-toxic appearing Pupils: equal, no scleral icterus Pulm: Lungs clear to auscultation, symmetric chest rise CV: regular rate and rhythm Abd: soft, min tender, nondistended. No cellulitis. No incisional hernia Ext: no edema, no calf tenderness Skin: no rash, no jaundice  Discharge Instructions  Discharge Instructions    Ambulate hourly while awake   Complete by: As directed    Call MD for:  difficulty breathing, headache or visual disturbances   Complete by: As directed    Call MD for:  persistant dizziness or light-headedness   Complete by: As directed    Call MD for:  persistant nausea and vomiting   Complete by: As directed    Call MD for:  redness, tenderness, or signs of infection (pain, swelling, redness, odor or green/yellow discharge around incision site)   Complete by: As directed    Call MD for:  severe uncontrolled pain   Complete by: As directed    Call MD for:  temperature >101 F   Complete by: As directed    Diet bariatric full liquid   Complete by: As directed    Discharge instructions   Complete by: As directed    See bariatric discharge instructions   Incentive spirometry   Complete by: As directed    Perform hourly while awake     Allergies as of 12/20/2018  Reactions   Other Anaphylaxis   Nuts--All Types   Bupropion Other (See Comments)   Made pt feel lethargic    Cetirizine Palpitations   Venlafaxine Palpitations      Medication List    TAKE these medications   acetaminophen 500 MG tablet Commonly known as: TYLENOL Take 2 tablets (1,000 mg total) by mouth every 8 (eight) hours for 5 days.   albuterol 108 (90 Base) MCG/ACT inhaler Commonly known as: VENTOLIN HFA INHALE 1 PUFF INTO THE LUNGS 4 (FOUR) TIMES DAILY AS NEEDED.   ALPRAZolam 0.25 MG tablet Commonly known as: XANAX Take 0.25 mg  by mouth 2 (two) times daily as needed for anxiety.   cholecalciferol 25 MCG (1000 UT) tablet Commonly known as: VITAMIN D3 Take 1,000 Units by mouth daily.   escitalopram 20 MG tablet Commonly known as: LEXAPRO TAKE 1/2 TABLET BY MOUTH ONCE PER DAY. What changed: See the new instructions.   fluticasone 50 MCG/ACT nasal spray Commonly known as: FLONASE Place 2 sprays into both nostrils daily. In each nostril What changed:   when to take this  reasons to take this   gabapentin 100 MG capsule Commonly known as: NEURONTIN Take 2 capsules (200 mg total) by mouth every 12 (twelve) hours.   montelukast 10 MG tablet Commonly known as: SINGULAIR Take 1 tablet (10 mg total) by mouth at bedtime. What changed:   when to take this  reasons to take this   ondansetron 4 MG disintegrating tablet Commonly known as: ZOFRAN-ODT Take 1 tablet (4 mg total) by mouth every 6 (six) hours as needed for nausea or vomiting.   pantoprazole 40 MG tablet Commonly known as: PROTONIX Take 1 tablet (40 mg total) by mouth daily.   traMADol 50 MG tablet Commonly known as: ULTRAM Take 1 tablet (50 mg total) by mouth every 6 (six) hours as needed (pain).   Womens Multivitamin Tabs Take 1 tablet by mouth daily.      Follow-up Information    Robyn Pickerel, MD. Go on 01/10/2019.   Specialty: General Surgery Why: at 1015 Contact information: [address] [place] [address] [phone]        Robyn Hurl, PA-C. Go on 02/15/2019.   Specialty: General Surgery Why: at 930 Contact information: [place] [address] [phone]            The results of significant diagnostics from this hospitalization (including imaging, microbiology, ancillary and laboratory) are listed below for reference.    Significant Diagnostic Studies: No results found.  Labs: Basic Metabolic Panel: Recent Labs  Lab 12/15/18 1143 12/20/18 0717  NA 136 135  K 3.8  4.2  CL 102 102  CO2 24 25  GLUCOSE 85 125*  BUN 14 6  CREATININE 0.73 0.75  CALCIUM 9.2 8.8*   Liver Function Tests: Recent Labs  Lab 12/15/18 1143 12/20/18 0717  AST 18 27  ALT 17 28  ALKPHOS 48 41  BILITOT 0.4 0.7  PROT 7.9 7.1  ALBUMIN 3.7 3.5    CBC: Recent Labs  Lab 12/15/18 1143 12/19/18 1312 12/20/18 0717  WBC 10.0  --  11.5*  NEUTROABS 6.9  --  9.6*  HGB 13.5 14.3 13.3  HCT 44.4 45.6 42.6  MCV 93.3  --  93.4  PLT 323  --  298    CBG: No results for input(s): GLUCAP in the last 168 hours.  Principal Problem:   Severe obesity ([place]) Active Problems:  Obstructive sleep apnea   Chronic pain of right knee   Chronic low back pain with left-sided sciatica   Gastric bypass status for obesity   Time coordinating discharge: 15 min  Signed:  Atilano InaEric M Orva Riles, MD [facility], [place] [phone] 12/21/2018, 10:53 AM

## 2018-12-25 ENCOUNTER — Telehealth (HOSPITAL_COMMUNITY): Payer: Self-pay

## 2018-12-25 NOTE — Telephone Encounter (Signed)
Patient called to discuss post bariatric surgery follow up questions.  See below:   1.  Tell me about your pain and pain management?denies  2.  Let's talk about fluid intake.  How much total fluid are you taking in?64 ounces   3.  How much protein have you taken in the last 2 days?60 grams of protein  4.  Have you had nausea?  Tell me about when have experienced nausea and what you did to help?nausea  5.  Has the frequency or color changed with your urine?urine dark in color, every couple of hours every 3 -4   6.  Tell me what your incisions look like?itchy  7.  Have you been passing gas? BM?had to call office due to constipation  8.  If a problem or question were to arise who would you call?  Do you know contact numbers for [facility], [facility], and [facility]?aware of how to contact all services  9.  How has the walking going?walking around no problem  10.  How are your vitamins and calcium going?  How are you taking them?mvi and calcium

## 2018-12-26 ENCOUNTER — Encounter: Payer: Managed Care, Other (non HMO) | Attending: General Surgery | Admitting: Registered"

## 2018-12-26 DIAGNOSIS — E669 Obesity, unspecified: Secondary | ICD-10-CM | POA: Insufficient documentation

## 2018-12-27 ENCOUNTER — Telehealth: Payer: Self-pay

## 2018-12-27 NOTE — Telephone Encounter (Signed)
Spoke to pt. She said she is doing well. Some fatigue. Stairs are hard to do. Some pain right shoulder. Keeping liquids down.

## 2019-01-02 ENCOUNTER — Other Ambulatory Visit: Payer: Self-pay

## 2019-01-02 ENCOUNTER — Encounter: Payer: Managed Care, Other (non HMO) | Admitting: Skilled Nursing Facility1

## 2019-01-02 DIAGNOSIS — E669 Obesity, unspecified: Secondary | ICD-10-CM | POA: Diagnosis not present

## 2019-01-03 NOTE — Progress Notes (Signed)
2 Week Post-Operative Nutrition Class   Patient was seen on 03/07/18 for Post-Operative Nutrition education at the [facility].    Surgery date: 12/19/2018 Surgery type: RYGB Start weight at [facility]: 456.5 Weight today: 418.2   Body Composition Scale 01/02/2019  Total Body Fat % 53.3  Visceral Fat 24  Fat-Free Mass % 46.6   Total Body Water % 37.8   Muscle-Mass lbs 36.7  Body Fat Displacement          Torso  lbs 138.7         Left Leg  lbs 27.7         Right Leg  lbs 2.7.7         Left Arm  lbs 13.8         Right Arm   lbs 13.8     The following the learning objectives were met by the patient during this course:  Identifies Phase 3 (Soft, High Proteins) Dietary Goals and will begin from 2 weeks post-operatively to 2 months post-operatively  Identifies appropriate sources of fluids and proteins   States protein recommendations and appropriate sources post-operatively  Identifies the need for appropriate texture modifications, mastication, and bite sizes when consuming solids  Identifies appropriate multivitamin and calcium sources post-operatively  Describes the need for physical activity post-operatively and will follow MD recommendations  States when to call healthcare provider regarding medication questions or post-operative complications   Handouts given during class include:  Phase 3A: Soft, High Protein Diet Handout   Follow-Up Plan: Patient will follow-up at [facility] in 6 weeks for 2 month post-op nutrition visit for diet advancement per MD.

## 2019-01-08 ENCOUNTER — Telehealth: Payer: Self-pay | Admitting: Skilled Nursing Facility1

## 2019-01-08 NOTE — Telephone Encounter (Signed)
RD called pt to verify fluid intake once starting soft, solid proteins 2 week post-bariatric surgery.   Daily Fluid intake: Daily Protein intake:  Concerns/issues:   LVM 

## 2019-01-18 ENCOUNTER — Ambulatory Visit (INDEPENDENT_AMBULATORY_CARE_PROVIDER_SITE_OTHER): Payer: 59 | Admitting: Psychiatry

## 2019-01-18 ENCOUNTER — Other Ambulatory Visit: Payer: Self-pay

## 2019-01-18 ENCOUNTER — Encounter (HOSPITAL_COMMUNITY): Payer: Self-pay | Admitting: Psychiatry

## 2019-01-18 DIAGNOSIS — F419 Anxiety disorder, unspecified: Secondary | ICD-10-CM | POA: Diagnosis not present

## 2019-01-18 DIAGNOSIS — F3341 Major depressive disorder, recurrent, in partial remission: Secondary | ICD-10-CM

## 2019-01-18 NOTE — Progress Notes (Signed)
Virtual Visit via Telephone Note  I connected with Robyn Jennings on 01/18/19 at  1:40 PM EST by telephone and verified that I am speaking with the correct person using two identifiers.   I discussed the limitations, risks, security and privacy concerns of performing an evaluation and management service by telephone and the availability of in person appointments. I also discussed with the patient that there may be a patient responsible charge related to this service. The patient expressed understanding and agreed to proceed.   History of Present Illness: Patient was evaluated by phone session.  Robyn Jennings recently had bariatric surgery and Robyn Jennings lost 50 pounds.  Robyn Jennings feels more energetic and Robyn Jennings has more motivation to do things.  Robyn Jennings still have some sore from surgery.  Robyn Jennings also back to work.  Robyn Jennings is taking Lexapro 10 mg which is prescribed by PCP.  Robyn Jennings has not taking Xanax since the last visit.  Robyn Jennings had a good support from Robyn Jennings parents.  Patient lives alone.  Robyn Jennings has no children.  Robyn Jennings is in therapy with Mr. Debe Coder and Robyn Jennings reported that is going well.  Robyn Jennings denies any crying spells or any feeling of hopelessness or worthlessness.  Since Robyn Jennings had bariatric surgery Robyn Jennings is more hopeful.  Robyn Jennings denies drinking or using any illegal substances.  Robyn Jennings spent Christmas with Robyn Jennings family.  Robyn Jennings wants to continue current medication from PCP.  However Robyn Jennings like to have a follow-up with psychiatrist in case Robyn Jennings needed more help.    Past psychiatric history; H/O inpatient in [place] in 2017 due to suicidal thoughts. Did IOP in [place]. Tried Wellbutrin by previous psychiatrist but did not work. H/O IOP in July 2020 at [facility]. No h/o suicidal attempt, psychosis, mania, hallucination, PTSD or any OCD symptoms.  Recent Results (from the past 2160 hour(s))  CBC WITH DIFFERENTIAL     Status: None   Collection Time: 12/15/18 11:43 AM  Result Value Ref Range   WBC 10.0 4.0 - 10.5 K/uL   RBC 4.76 3.87 - 5.11 MIL/uL   Hemoglobin 13.5 12.0  - 15.0 g/dL   HCT 03.4 74.2 - 59.5 %   MCV 93.3 80.0 - 100.0 fL   MCH 28.4 26.0 - 34.0 pg   MCHC 30.4 30.0 - 36.0 g/dL   RDW 63.8 75.6 - 43.3 %   Platelets 323 150 - 400 K/uL   nRBC 0.0 0.0 - 0.2 %   Neutrophils Relative % 69 %   Neutro Abs 6.9 1.7 - 7.7 K/uL   Lymphocytes Relative 22 %   Lymphs Abs 2.2 0.7 - 4.0 K/uL   Monocytes Relative 7 %   Monocytes Absolute 0.7 0.1 - 1.0 K/uL   Eosinophils Relative 2 %   Eosinophils Absolute 0.2 0.0 - 0.5 K/uL   Basophils Relative 0 %   Basophils Absolute 0.0 0.0 - 0.1 K/uL   Immature Granulocytes 0 %   Abs Immature Granulocytes 0.04 0.00 - 0.07 K/uL    Comment: Performed at [facility], [address], [place], [place] [address]  Comprehensive metabolic panel     Status: None   Collection Time: 12/15/18 11:43 AM  Result Value Ref Range   Sodium 136 135 - 145 mmol/L   Potassium 3.8 3.5 - 5.1 mmol/L   Chloride 102 98 - 111 mmol/L   CO2 24 22 - 32 mmol/L   Glucose, Bld 85 70 - 99 mg/dL   BUN 14 6 - 20 mg/dL   Creatinine, Ser 8.41  0.44 - 1.00 mg/dL   Calcium 9.2 8.9 - 67.5 mg/dL   Total Protein 7.9 6.5 - 8.1 g/dL   Albumin 3.7 3.5 - 5.0 g/dL   AST 18 15 - 41 U/L   ALT 17 0 - 44 U/L   Alkaline Phosphatase 48 38 - 126 U/L   Total Bilirubin 0.4 0.3 - 1.2 mg/dL   GFR calc non Af Amer >60 >60 mL/min   GFR calc Af Amer >60 >60 mL/min   Anion gap 10 5 - 15    Comment: Performed at [facility], [address] [place], [place] [address]  Type and screen     Status: None   Collection Time: 12/15/18 11:43 AM  Result Value Ref Range   ABO/RH(D) A POS    Antibody Screen NEG    Sample Expiration 12/22/2018,2359    Extend sample reason      NO TRANSFUSIONS OR PREGNANCY IN THE PAST 3 MONTHS Performed at [facility], [address], [place], [place] [address]   ABO/Rh     Status: None   Collection Time: 12/15/18 11:43 AM  Result Value Ref Range   ABO/RH(D)      A POS Performed at  [facility], [address], [place], [place] [address]   Novel Coronavirus, NAA [facility] order, Send-out to Ref Lab; TAT 18-24 hrs     Status: None   Collection Time: 12/15/18 12:32 PM   Specimen: Nasopharyngeal Swab; Respiratory  Result Value Ref Range   SARS-CoV-2, NAA NOT DETECTED NOT DETECTED    Comment: (NOTE) This nucleic acid amplification test was developed and its performance characteristics determined by [employer]. Nucleic acid amplification tests include PCR and TMA. This test has not been FDA cleared or approved. This test has been authorized by FDA under an Emergency Use Authorization (EUA). This test is only authorized for the duration of time the declaration that circumstances exist justifying the authorization of the emergency use of in vitro diagnostic tests for detection of SARS-CoV-2 virus and/or diagnosis of COVID-19 infection under section 564(b)(1) of the Act, 21 U.S.C. 883GPQ-9(I) (1), unless the authorization is terminated or revoked sooner. When diagnostic testing is negative, the possibility of a false negative result should be considered in the context of a patient's recent exposures and the presence of clinical signs and symptoms consistent with COVID-19. An individual without symptoms of COVID- 19 and who is not shedding SARS-CoV-2 vi rus would expect to have a negative (not detected) result in this assay. Performed At: [facility] [address] [place], [place] [id number] Jolene Schimke MD MM:[id number]    Coronavirus Source NASOPHARYNGEAL     Comment: Performed at [facility], [address], [place], [place] [address]  Pregnancy, urine STAT morning of surgery     Status: None   Collection Time: 12/19/18  5:25 AM  Result Value Ref Range   Preg Test, Ur NEGATIVE NEGATIVE    Comment:        THE SENSITIVITY OF THIS METHODOLOGY IS >20 mIU/mL. Performed at [facility], [address] [place], [place] [address]   Hemoglobin and hematocrit, blood     Status: None   Collection Time: 12/19/18  1:12 PM  Result Value Ref Range   Hemoglobin 14.3 12.0 - 15.0 g/dL   HCT 24.4 62.8 - 63.8 %    Comment: Performed at [facility], [address], [place], [place] [address]  CBC WITH DIFFERENTIAL     Status: Abnormal   Collection Time: 12/20/18  7:17 AM  Result Value Ref Range   WBC 11.5 (H) 4.0 - 10.5 K/uL   RBC 4.56 3.87 - 5.11 MIL/uL   Hemoglobin 13.3 12.0 - 15.0 g/dL   HCT 42.6 36.0 - 46.0 %   MCV 93.4 80.0 - 100.0 fL   MCH 29.2 26.0 - 34.0 pg   MCHC 31.2 30.0 - 36.0 g/dL   RDW 13.9 11.5 - 15.5 %   Platelets 298 150 - 400 K/uL   nRBC 0.0 0.0 - 0.2 %   Neutrophils Relative % 83 %   Neutro Abs 9.6 (H) 1.7 - 7.7 K/uL   Lymphocytes Relative 8 %   Lymphs Abs 0.9 0.7 - 4.0 K/uL   Monocytes Relative 9 %   Monocytes Absolute 1.0 0.1 - 1.0 K/uL   Eosinophils Relative 0 %   Eosinophils Absolute 0.0 0.0 - 0.5 K/uL   Basophils Relative 0 %   Basophils Absolute 0.0 0.0 - 0.1 K/uL   Immature Granulocytes 0 %   Abs Immature Granulocytes 0.04 0.00 - 0.07 K/uL    Comment: Performed at [facility], [place] [address] [place], [place] [address]  Comprehensive metabolic panel     Status: Abnormal   Collection Time: 12/20/18  7:17 AM  Result Value Ref Range   Sodium 135 135 - 145 mmol/L   Potassium 4.2 3.5 - 5.1 mmol/L   Chloride 102 98 - 111 mmol/L   CO2 25 22 - 32 mmol/L   Glucose, Bld 125 (H) 70 - 99 mg/dL   BUN 6 6 - 20 mg/dL   Creatinine, Ser 0.75 0.44 - 1.00 mg/dL   Calcium 8.8 (L) 8.9 - 10.3 mg/dL   Total Protein 7.1 6.5 - 8.1 g/dL   Albumin 3.5 3.5 - 5.0 g/dL   AST 27 15 - 41 U/L   ALT 28 0 - 44 U/L   Alkaline Phosphatase 41 38 - 126 U/L   Total Bilirubin 0.7 0.3 - 1.2 mg/dL   GFR calc non Af Amer >60 >60 mL/min   GFR calc Af Amer >60 >60 mL/min   Anion gap 8 5 - 15    Comment: Performed at [facility], [place]  [address] [place], [place] [address]      Psychiatric Specialty Exam: Physical Exam  Review of Systems  There were no vitals taken for this visit.There is no height or weight on file to calculate BMI.  General Appearance: NA  Eye Contact:  NA  Speech:  Clear and Coherent and Slow  Volume:  Normal  Mood:  Euthymic  Affect:  NA  Thought Process:  Goal Directed  Orientation:  Full (Time, Place, and Person)  Thought Content:  WDL  Suicidal Thoughts:  No  Homicidal Thoughts:  No  Memory:  Immediate;   Good Recent;   Good Remote;   Good  Judgement:  Good  Insight:  Present  Psychomotor Activity:  NA  Concentration:  Concentration: Good and Attention Span: Good  Recall:  Good  Fund of Knowledge:  Good  Language:  Good  Akathisia:  No  Handed:  Right  AIMS (if indicated):     Assets:  Communication Skills Desire for Improvement Housing Resilience Social Support  ADL's:  Intact  Cognition:  WNL  Sleep:   good      Assessment and Plan: Major depressive disorder, recurrent.  Anxiety.  Patient is doing  very well on Robyn Jennings Lexapro 10 mg.  Robyn Jennings has not taking Xanax since the last visit.  I reviewed Robyn Jennings medication and blood work.  Robyn Jennings is no longer taking gabapentin, Zofran, pain medication and Xanax.  Robyn Jennings preferred to continue Lexapro from PCP.  I encouraged to continue therapy with Bobetta Lime.  Robyn Jennings like to have appointment with psychiatrist in case Robyn Jennings needed help or if the medicine stopped working.  We will schedule in 4 months but recommended to call us back if Robyn Jennings agree worsening of the symptoms.  Follow Up Instructions:    I discussed the assessment and treatment plan with the patient. The patient was provided an opportunity to ask questions and all were answered. The patient agreed with the plan and demonstrated an understanding of the instructions.   The patient was advised to call back or seek an in-person evaluation if the symptoms worsen or if the condition fails to  improve as anticipated.  I provided 15 minutes of non-face-to-face time during this encounter.   Cleotis Nipper, MD

## 2019-02-12 ENCOUNTER — Other Ambulatory Visit: Payer: Self-pay

## 2019-02-12 ENCOUNTER — Encounter: Payer: 59 | Attending: General Surgery | Admitting: Dietician

## 2019-02-12 DIAGNOSIS — E669 Obesity, unspecified: Secondary | ICD-10-CM | POA: Insufficient documentation

## 2019-02-12 NOTE — Patient Instructions (Signed)

## 2019-02-12 NOTE — Progress Notes (Signed)
Bariatric Nutrition Follow-Up Visit Medical Nutrition Therapy  Appt Start Time: 4:45pm   End Time: 5:15pm  2 Months Post-Operative RYGB Surgery Surgery Date: 12/19/2018  Pt's Expectations of Surgery/ Goals: longevity with keeping weight off Pt Reported Successes: not feeling as winded, other people noticing weight loss, joint pain relief   NUTRITION ASSESSMENT  Anthropometrics  Start weight at [facility]: 456.4 lbs (date: 02/01/2018) Today's weight: 394.2 lbs  Body Composition Scale 01/02/2019 02/12/2019  Weight  lbs 418.2 394.2  BMI 65.5 61.5  Total Body Fat  % 53.3 52.3     Visceral Fat 24 22  Fat-Free Mass  % 46.6 47.6     Total Body Water  % 37.8 38.3     Muscle-Mass  lbs 36.7 36.5  Body Fat Displacement --- ---         Torso  lbs 138.7 128.3         Left Leg  lbs 27.7 25.6         Right Leg  lbs 27.7 25.6         Left Arm  lbs 13.8 12.8         Right Arm  lbs 13.8 12.8    Lifestyle & Dietary Hx Limiting dairy now. Meats include ground [place], chicken, sausage, and beef. Also eats beans   24-Hr Dietary Recall First Meal: protein shake  Snack: ground meat  Second Meal: chili Snack: protein shake   Third Meal: chicken soup Snack: - Beverages: water, Gatorade Zero, hot tea  Estimated daily fluid intake: 64+ oz Estimated daily protein intake: 60 g Supplements: bariatric MVI, calcium  Current average weekly physical activity: walking, exercises    Post-Op Goals/ Signs/ Symptoms Using straws: no Drinking while eating: no Chewing/swallowing difficulties: no Changes in vision: no Changes to mood/headaches: no Hair loss/changes to skin/nails: no Difficulty focusing/concentrating: no  Sweating: no Dizziness/lightheadedness: no Palpitations: no  Carbonated/caffeinated beverages: no N/V/D/C/Gas: no Abdominal pain: no Dumping syndrome: no   NUTRITION DIAGNOSIS  Overweight/obesity ([place]-3.3) related to past poor dietary habits and physical inactivity as evidenced by  completed bariatric surgery and following dietary guidelines for continued weight loss and healthy nutrition status.   NUTRITION INTERVENTION Nutrition counseling (C-1) and education (E-2) to facilitate bariatric surgery goals, including: . Diet advancement to the next phase (phase 4) now including non-starchy vegetables  . The importance of consuming adequate calories as well as certain nutrients daily due to the body's need for essential vitamins, minerals, and fats . The importance of daily physical activity and to reach a goal of at least 150 minutes of moderate to vigorous physical activity weekly (or as directed by their physician) due to benefits such as increased musculature and improved lab values  Handouts Provided Include   Phase 4: Protein + Non-Starchy Vegetables   Learning Style & Readiness for Change Teaching method utilized: Visual & Auditory  Demonstrated degree of understanding via: Teach Back  Barriers to learning/adherence to lifestyle change: None Identified    MONITORING & EVALUATION Dietary intake, weekly physical activity, body weight, and goals in 4 months.  Next Steps Patient is to follow-up in 4 months for 6 month post-op follow-up.

## 2019-02-13 ENCOUNTER — Encounter: Payer: Self-pay | Admitting: Dietician

## 2019-05-16 ENCOUNTER — Telehealth (HOSPITAL_COMMUNITY): Payer: 59 | Admitting: Psychiatry

## 2019-06-19 ENCOUNTER — Encounter: Payer: Self-pay | Admitting: Internal Medicine

## 2019-06-19 ENCOUNTER — Encounter: Payer: 59 | Attending: General Surgery | Admitting: Skilled Nursing Facility1

## 2019-06-19 ENCOUNTER — Other Ambulatory Visit: Payer: Self-pay

## 2019-06-19 ENCOUNTER — Ambulatory Visit (INDEPENDENT_AMBULATORY_CARE_PROVIDER_SITE_OTHER): Payer: 59 | Admitting: Internal Medicine

## 2019-06-19 VITALS — BP 110/80 | HR 69 | Temp 97.8°F | Ht 67.0 in | Wt 348.0 lb

## 2019-06-19 DIAGNOSIS — M79672 Pain in left foot: Secondary | ICD-10-CM | POA: Diagnosis not present

## 2019-06-19 DIAGNOSIS — Z9884 Bariatric surgery status: Secondary | ICD-10-CM

## 2019-06-19 DIAGNOSIS — Z0000 Encounter for general adult medical examination without abnormal findings: Secondary | ICD-10-CM

## 2019-06-19 DIAGNOSIS — E669 Obesity, unspecified: Secondary | ICD-10-CM

## 2019-06-19 DIAGNOSIS — F3341 Major depressive disorder, recurrent, in partial remission: Secondary | ICD-10-CM | POA: Diagnosis not present

## 2019-06-19 MED ORDER — KETOCONAZOLE 2 % EX CREA: 1.0000 "application " | TOPICAL_CREAM | Freq: Every day | CUTANEOUS | 1 refills | Status: DC

## 2019-06-19 MED ORDER — EPINEPHRINE 0.3 MG/0.3ML IJ SOSY: 1.0000 | PREFILLED_SYRINGE | Freq: Once | INTRAMUSCULAR | 3 refills | Status: AC

## 2019-06-19 MED ORDER — ESCITALOPRAM OXALATE 10 MG PO TABS: 10.0000 mg | ORAL_TABLET | Freq: Every day | ORAL | 3 refills | Status: DC

## 2019-06-19 NOTE — Assessment & Plan Note (Signed)
Seems mechanical Will set up with Dr Patsy Lager

## 2019-06-19 NOTE — Progress Notes (Signed)
Subjective:    Patient ID: Rosangela Fehrenbach, female    DOB: 07-07-1980, 39 y.o.   MRN: [id number]  HPI Here for physical This visit occurred during the SARS-CoV-2 public health emergency.  Safety protocols were in place, including screening questions prior to the visit, additional usage of staff PPE, and extensive cleaning of exam room while observing appropriate contact time as indicated for disinfecting solutions.   Lost 120# after the bariatric surgery Very happy with that Doing regular exercise  Back at work-- now Nurse, adult This is going well---promotion  Having pain in left foot---daily Is on feet all day--but this is constant--mostly around heel  Tylenol does help for 6 hours but doesn't like taking Not dependent on shoe Past ankle problems Has orthotics in the past  Has "knot" in back (but not actually sure it is a knot) Thinks it is from a previous weight lifting injury Has come back--not better with stretching, etc Will throb  Current Outpatient Medications on File Prior to Visit  Medication Sig Dispense Refill   albuterol (VENTOLIN HFA) 108 (90 Base) MCG/ACT inhaler INHALE 1 PUFF INTO THE LUNGS 4 (FOUR) TIMES DAILY AS NEEDED. 18 g 1   ALPRAZolam (XANAX) 0.25 MG tablet Take 0.25 mg by mouth 2 (two) times daily as needed for anxiety.     escitalopram (LEXAPRO) 20 MG tablet TAKE 1/2 TABLET BY MOUTH ONCE PER DAY. (Patient taking differently: Take 10 mg by mouth daily. ) 45 tablet 3   fluticasone (FLONASE) 50 MCG/ACT nasal spray Place 2 sprays into both nostrils daily. In each nostril 16 g 12   montelukast (SINGULAIR) 10 MG tablet Take 1 tablet (10 mg total) by mouth at bedtime. 90 tablet 3   Multiple Vitamins-Minerals (WOMENS MULTIVITAMIN) TABS Take 1 tablet by mouth daily.      No current facility-administered medications on file prior to visit.    Allergies  Allergen Reactions   Other Anaphylaxis    Nuts--All Types   Bupropion Other (See  Comments)    Made pt feel lethargic    Cetirizine Palpitations   Venlafaxine Palpitations    Past Medical History:  Diagnosis Date   Asthma    seasonal well controlled   Family history of adverse reaction to anesthesia    grandmother took days to "wake up"   GERD (gastroesophageal reflux disease)    Major depression in partial remission ([place])    Morbid obesity ([place])    Nasal polyposis    Obstructive sleep apnea    no C-Pap    Past Surgical History:  Procedure Laterality Date   ANKLE SURGERY Left 2003   drilling for new collagen   EYE SURGERY Bilateral    PRK   FRACTURE SURGERY     LAPAROSCOPIC ROUX-EN-Y GASTRIC BYPASS WITH HIATAL HERNIA REPAIR N/A 12/19/2018   Procedure: LAPAROSCOPIC ROUX-EN-Y GASTRIC BYPASS WITH HIATAL HERNIA REPAIR, Upper Endo-ERAS Pathway;  Surgeon: [name], MD;  Location: [facility];  Service: General;  Laterality: N/A;   NASAL POLYP EXCISION      Family History  Problem Relation Age of Onset   Lupus Father    Heart disease Father    Diabetes Maternal Grandmother    Heart disease Paternal Grandmother    Lupus Brother    Cancer Neg Hx     Social History   Socioeconomic History   Marital status: Single    Spouse name: Not on file   Number of children: 0   Years of education:  Not on file   Highest education level: Not on file  Occupational History   Occupation: Visual merchandiser    Comment: Day care setting  Tobacco Use   Smoking status: Never Smoker   Smokeless tobacco: Never Used  Substance and Sexual Activity   Alcohol use: Not Currently   Drug use: Never   Sexual activity: Not on file  Other Topics Concern   Not on file  Social History Narrative   Not on file   Social Determinants of Health   Financial Resource Strain:    Difficulty of Paying Living Expenses:   Food Insecurity:    Worried About Programme researcher, broadcasting/film/video in the Last Year:    Barista in the Last Year:     Transportation Needs:    Freight forwarder (Medical):    Lack of Transportation (Non-Medical):   Physical Activity:    Days of Exercise per Week:    Minutes of Exercise per Session:   Stress:    Feeling of Stress :   Social Connections:    Frequency of Communication with Friends and Family:    Frequency of Social Gatherings with Friends and Family:    Attends Religious Services:    Active Member of Clubs or Organizations:    Attends Engineer, structural:    Marital Status:   Intimate Partner Violence:    Fear of Current or Ex-Partner:    Emotionally Abused:    Physically Abused:    Sexually Abused:    Review of Systems  Constitutional: Negative for fatigue.       Wears seat belt  HENT: Negative for hearing loss and tinnitus.        Regular dental care  Eyes: Negative for visual disturbance.       No diplopia or unilateral vision loss  Respiratory: Positive for cough and wheezing. Negative for chest tightness.        Asthma seems to be allergy triggered Albuterol at most once a week  Cardiovascular: Negative for chest pain and palpitations.       Uses compression sleeves for edema--some pain  Gastrointestinal: Negative for blood in stool and constipation.       Abdominal skin fold --gets rash under that No regular dumping  Endocrine: Negative for polydipsia and polyuria.  Genitourinary: Negative for difficulty urinating, dysuria and hematuria.  Musculoskeletal: Positive for arthralgias and back pain.  Allergic/Immunologic: Positive for environmental allergies. Negative for immunocompromised state.       On montelukast Uses benedryl at night prn  Neurological: Negative for dizziness, syncope, light-headedness and headaches.  Hematological: Negative for adenopathy. Does not bruise/bleed easily.  Psychiatric/Behavioral: Negative for dysphoric mood and sleep disturbance. The patient is nervous/anxious.        More anxiety recently Depression is  better Relates to work stress--worrying about dad with another MI, etc Uses xanax occasionally and it helps (and helps her sleep if mind going)       Objective:   Physical Exam  Constitutional: She is oriented to person, place, and time. She appears well-developed. No distress.  HENT:  Head: Normocephalic and atraumatic.  Right Ear: External ear normal.  Left Ear: External ear normal.  Mouth/Throat: Oropharynx is clear and moist. No oropharyngeal exudate.  Eyes: Pupils are equal, round, and reactive to light. Conjunctivae are normal.  Neck: No thyromegaly present.  Cardiovascular: Normal rate, regular rhythm, normal heart sounds and intact distal pulses. Exam reveals no gallop.  No  murmur heard. Respiratory: Effort normal and breath sounds normal. No respiratory distress. She has no wheezes. She has no rales.  GI: Soft. There is no abdominal tenderness.  Pannus with slight redness  Musculoskeletal:        General: No edema.     Comments: No obvious foot findings Pain area is between right lower scapula and spine--discussed muscular etiology  Lymphadenopathy:    She has no cervical adenopathy.  Neurological: She is alert and oriented to person, place, and time.  Skin: No rash noted. No erythema.  Psychiatric: She has a normal mood and affect. Her behavior is normal.           Assessment & Plan:

## 2019-06-19 NOTE — Assessment & Plan Note (Signed)
Healthy  had COVID vaccine Recommended flu vaccine Working on fitness

## 2019-06-19 NOTE — Assessment & Plan Note (Signed)
Marked weight loss Doing well Has follow up today

## 2019-06-19 NOTE — Assessment & Plan Note (Signed)
Doing fairly well on lexapro Continues with therapist

## 2019-06-21 NOTE — Progress Notes (Signed)
Follow-up visit:  Post-Operative RYGB Surgery  Medical Nutrition Therapy:  Appt start time: 6:00pm end time:  7:00pm  Primary concerns today: Post-operative Bariatric Surgery Nutrition Management 6 Month Post-Op Class  Pt states she has hair loss: Dietitian tested follicle and it was strong. Advised to continue to monitor and call surgeon if worsens or patches of loss occur.   Surgery date: 12/19/2018 Surgery type: RYGB Start weight at [facility]: 456.5 Weight today: 345.7   Body Composition Scale 06/19/2019  Weight  lbs 345.7  Total Body Fat  % 50     Visceral Fat 19  Fat-Free Mass  % 49.9     Total Body Water  % 39.4     Muscle-Mass  lbs 36  BMI 53.9  Body Fat Displacement ---        Torso  lbs 107.4        Left Leg  lbs 21.4        Right Leg  lbs 21.4        Left Arm  lbs 10.7        Right Arm  lbs 10.7     Information Reviewed/ Discussed During Appointment: -Review of composition scale numbers -Fluid requirements (64-100 ounces) -Protein requirements (60-80g) -Strategies for tolerating diet -Advancement of diet to include Starchy vegetables -Barriers to inclusion of new foods -Inclusion of appropriate multivitamin and calcium supplements  -Exercise recommendations   Fluid intake: adequate   Medications: See List Supplementation: appropriate    Using straws: no Drinking while eating: no Having you been chewing well: yes Chewing/swallowing difficulties: no Changes in vision: no Changes to mood/headaches: no Hair loss/C[place] to skin/Changes to nails: no Any difficulty focusing or concentrating: no Sweating: no Dizziness/Lightheaded: no Palpitations: no  Carbonated beverages: no N/V/D/C/GAS: no Abdominal Pain: no Dumping syndrome: no  Recent physical activity:  ADL's  Progress Towards Goal(s):  In Progress  Handouts given during visit include:  Phase V diet Progression   Goals Sheet  The Benefits of Exercise are endless.....  Support Group  Topics  Pt Chosen Goals:  Mental Health and Well Being: I will say 2 nice things to and about myself 7 days a week by (specific date) ______09________  I will take the stairs whenever available by (specific date) ___09___________________________   Teaching Method Utilized:  Visual Auditory Hands on  Demonstrated degree of understanding via:  Teach Back   Monitoring/Evaluation:  Dietary intake, exercise, and body weight. Follow up in 3 months for 9 month post-op visit.

## 2019-07-19 ENCOUNTER — Other Ambulatory Visit: Payer: Self-pay | Admitting: Internal Medicine

## 2019-09-18 ENCOUNTER — Encounter: Payer: 59 | Attending: General Surgery | Admitting: Dietician

## 2019-09-18 ENCOUNTER — Encounter: Payer: Self-pay | Admitting: Dietician

## 2019-09-18 ENCOUNTER — Other Ambulatory Visit: Payer: Self-pay

## 2019-09-18 DIAGNOSIS — E669 Obesity, unspecified: Secondary | ICD-10-CM | POA: Insufficient documentation

## 2019-09-18 NOTE — Progress Notes (Signed)
Bariatric Nutrition Follow-Up Visit Medical Nutrition Therapy  Appt Start Time: 4:45pm    End Time: 5:20pm  9 Months Post-Operative RYGB Surgery Surgery Date: 12/19/2018  Pt's Expectations of Surgery/ Goals: longevity with keeping weight off  Pt Reported Successes: more energy, overall feels better, less joint pain, sleeping better, more confidence     NUTRITION ASSESSMENT  Anthropometrics  Start weight at [facility]: 456.5 lbs (date: 02/01/2018) Today's weight: pt declined    Lifestyle & Dietary Hx Patient states she has recognized which foods do/do not work for her. Does not drink Gatorade Zero due to it making her feel badly. Cannot have nuts due to allergy. Incorporating more dairy, which she was avoiding before. Has not been taking supplements, so we talked about options.   24-Hr Dietary Recall First Meal: protein shake (or cottage cheese + peaches)  Snack: -  Second Meal: chicken + broccoli + corn + carrots  Snack: [place] yogurt   Third Meal: chicken & veggie soup (or kale/quinoa bowl with chicken, black beans, and corn)  Snack: - Beverages: water, coffee, hot tea  Estimated daily fluid intake: 64-90 oz Estimated daily protein intake: 60+ g Supplements: not taking  Current average weekly physical activity: walking, sometimes yoga   Post-Op Goals/ Signs/ Symptoms Using straws: no Drinking while eating: no Chewing/swallowing difficulties: no Changes in vision: no Changes to mood/headaches: no Hair loss/changes to skin/nails: no Difficulty focusing/concentrating: no Sweating: no Dizziness/lightheadedness: no Palpitations: no  Carbonated/caffeinated beverages: coffee N/V/D/C/Gas: no Abdominal pain: no Dumping syndrome: yes (once when ate too quickly)    NUTRITION DIAGNOSIS  Overweight/obesity ([place]-3.3) related to past poor dietary habits and physical inactivity as evidenced by completed bariatric surgery and following dietary guidelines for continued weight loss and  healthy nutrition status.   NUTRITION INTERVENTION Nutrition counseling (C-1) and education (E-2) to facilitate bariatric surgery goals, including: . Diet advancement to the next phase (phase 6) now including fruit . The importance of consuming adequate calories as well as certain nutrients daily due to the body's need for essential vitamins, minerals, and fats . The importance of daily physical activity and to reach a goal of at least 150 minutes of moderate to vigorous physical activity weekly (or as directed by their physician) due to benefits such as increased musculature and improved lab values  Handouts Provided Include   Phase 6: Protein + Vegetables + Fruit  Learning Style & Readiness for Change Teaching method utilized: Visual & Auditory  Demonstrated degree of understanding via: Teach Back  Barriers to learning/adherence to lifestyle change: None Identified    MONITORING & EVALUATION Dietary intake, weekly physical activity, body weight, and goals in 3 months.  Next Steps Patient is to follow-up in 3 months for 12 month post-op follow-up.

## 2019-09-18 NOTE — Patient Instructions (Signed)
.   Continue to aim for a minimum of 64 fluid ounces daily with at least 32 ounces being plain water. . Eat non-starchy vegetables 2 times a day 7 days a week. . Per meal/snack, eat 2-3 ounces of protein first. o Add in fruit throughout the day which you may substitute starchy vegetables with (fruits and starchy vegetables are both great sources of complex carbohydrates.) o Eat fruits as long as you are able to meet your daily protein goal and still get in non-starchy vegetables 2 times per day. Whatever else you have room for may be fruit and/or starchy vegetables (potatoes, sweet potatoes, corn, and peas.)  . Continue to aim for 30 minutes of physical activity at least 5 times a week. . Remember to take 3 calcium's plus your bariatric multivitamin DAILY.  

## 2019-10-16 LAB — RESULTS CONSOLE HPV: CHL HPV: NEGATIVE

## 2019-10-16 LAB — HM PAP SMEAR

## 2019-10-25 IMAGING — US VENOUS DOPPLER ULTRASOUND OF LEFT LOWER EXTREMITY
1 series · 13 of 24 positions shown · non-contrast
Comparison: None.

CLINICAL DATA: Left calf and knee pain



[Series 1: venous doppler ultrasound of left lower extremity · 0.10mm/px · 13 of 32 slices shown]
[im 1/32]
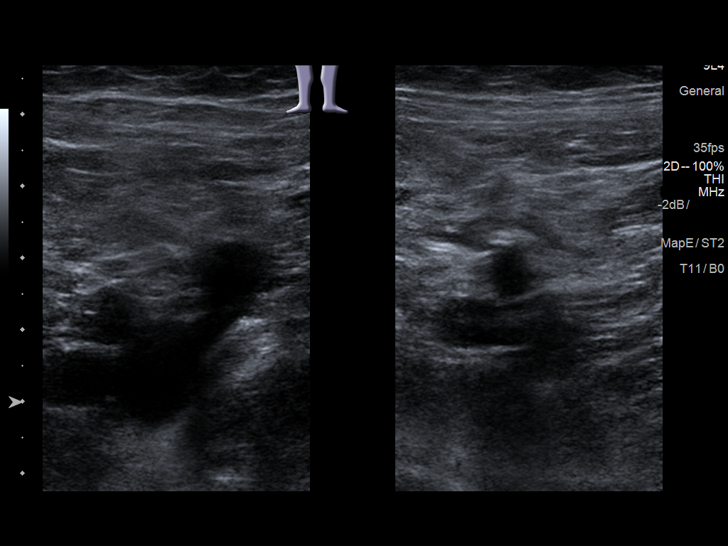
[im 3/32]
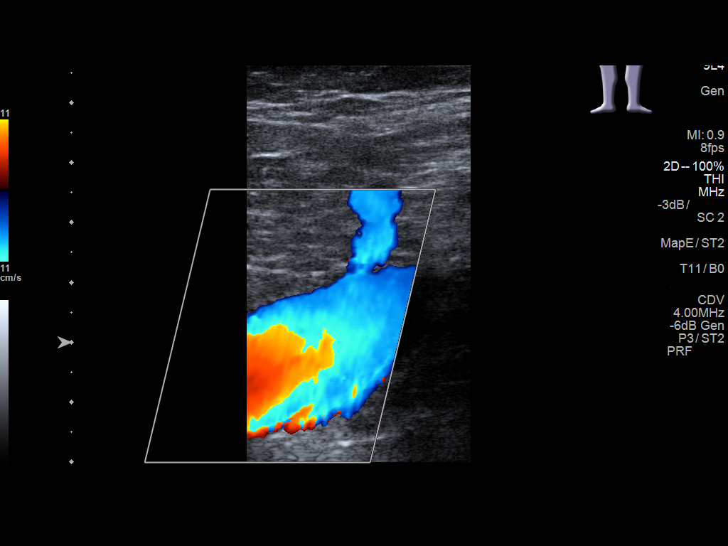
[im 6/32]
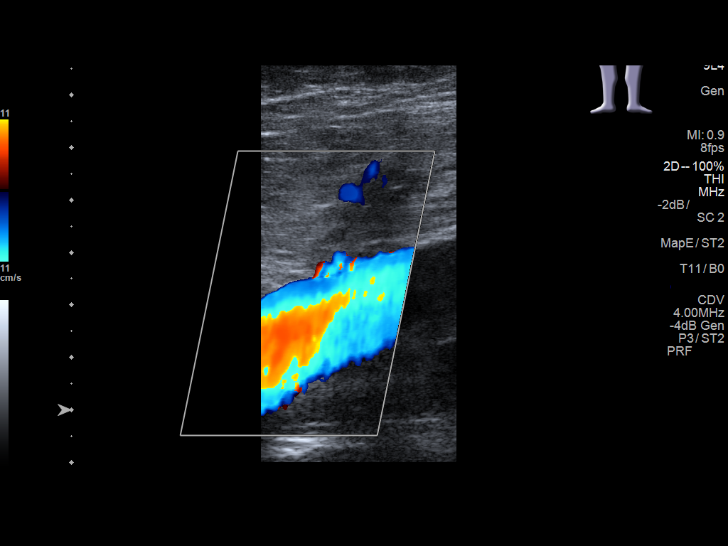
[im 9/32]
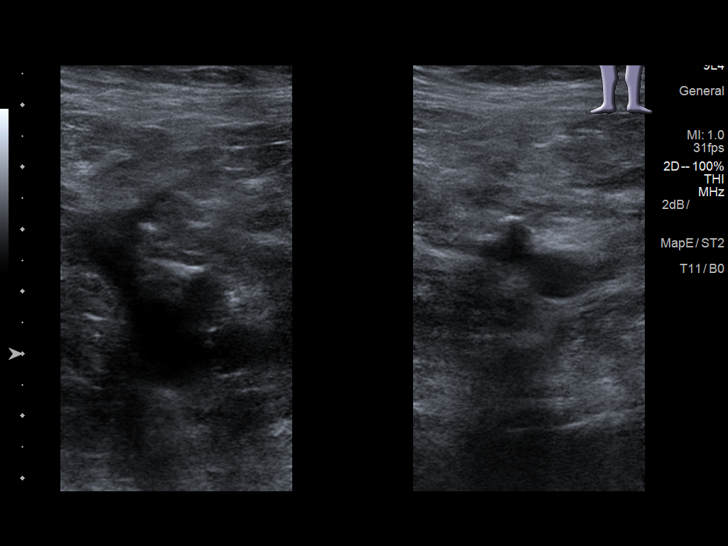
[im 11/32]
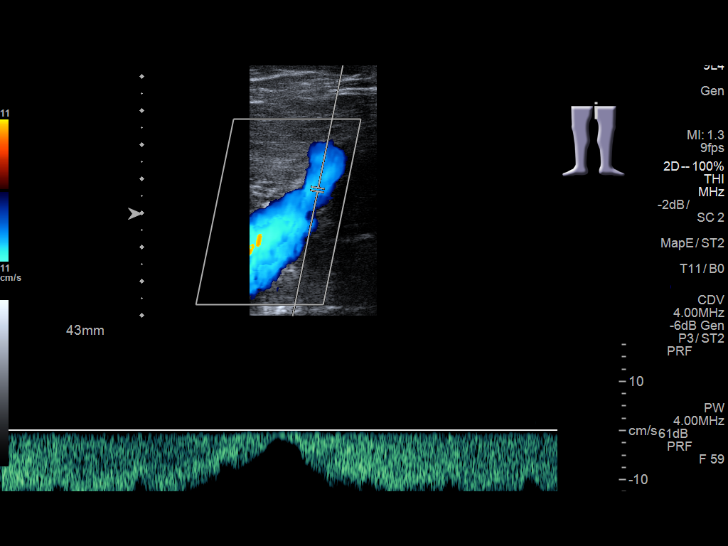
[im 14/32]
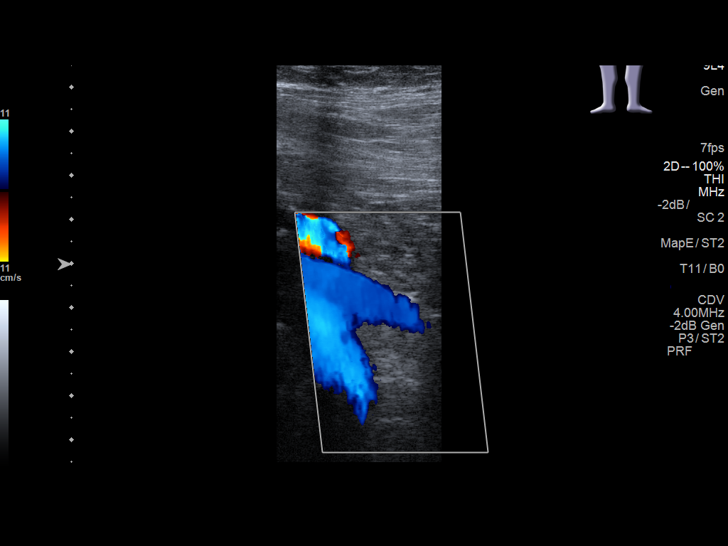
[im 17/32]
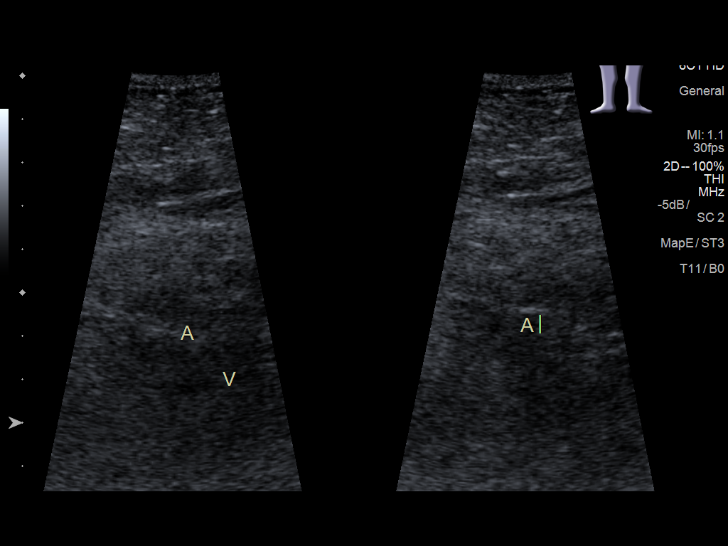
[im 18/32]
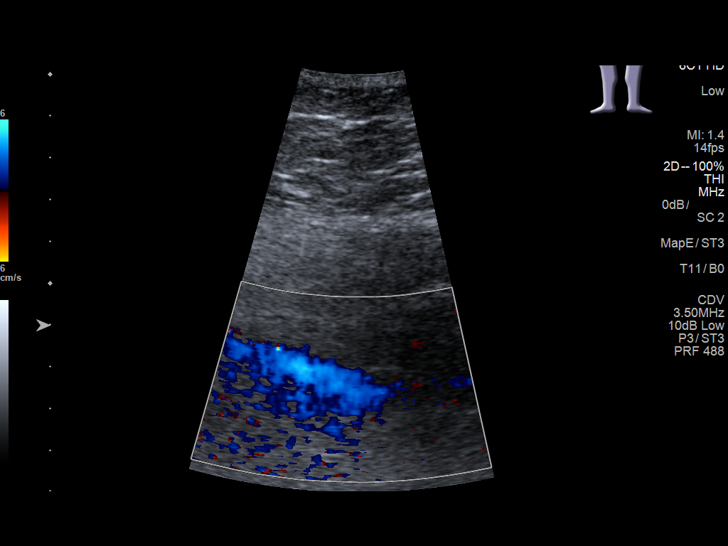
[im 21/32]
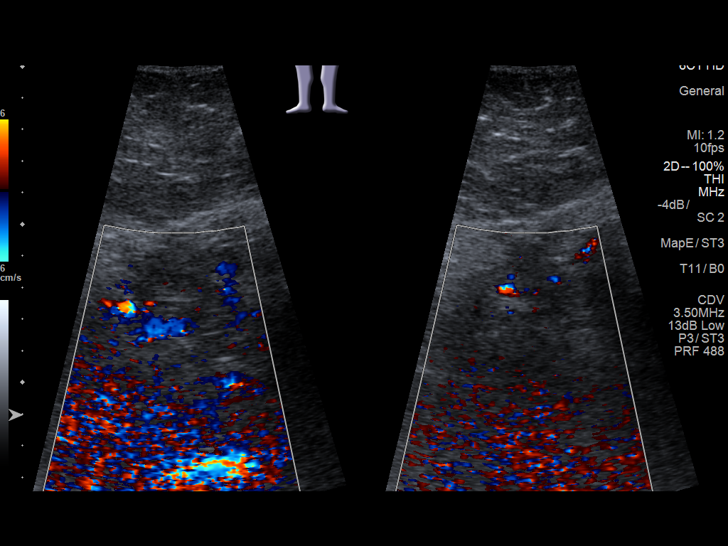
[im 23/32]
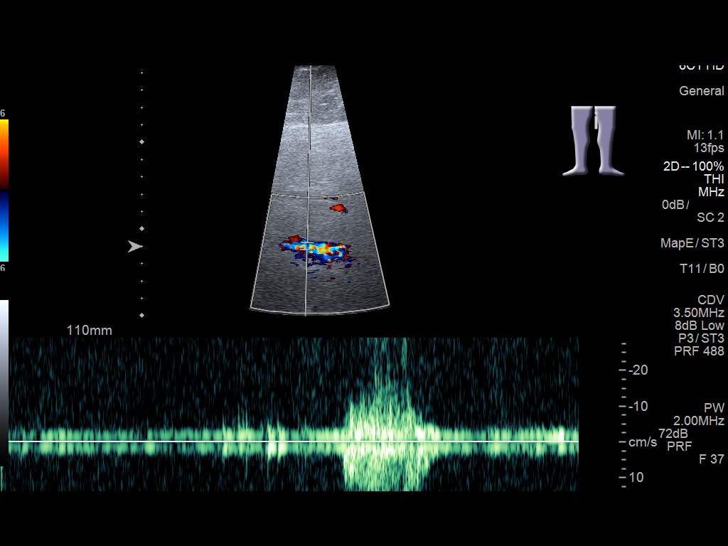
[im 26/32]
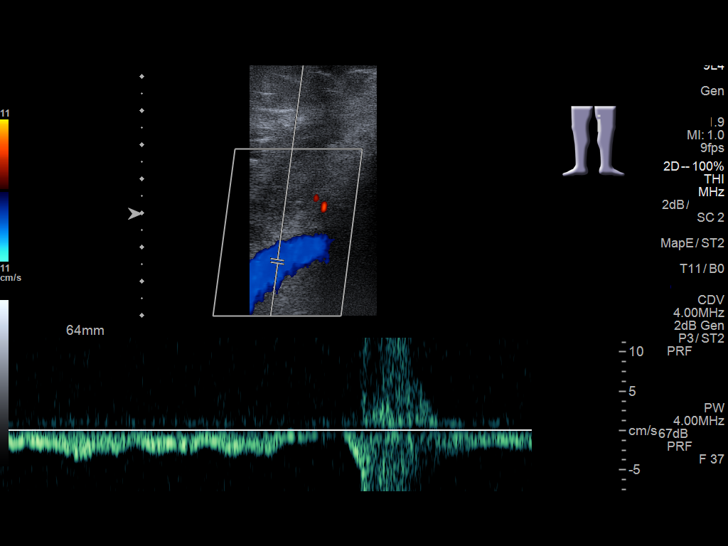
[im 29/32]
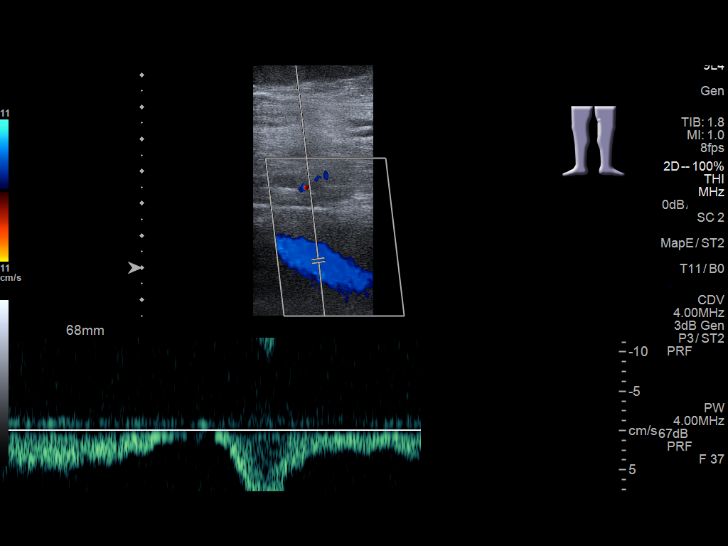
[im 32/32]
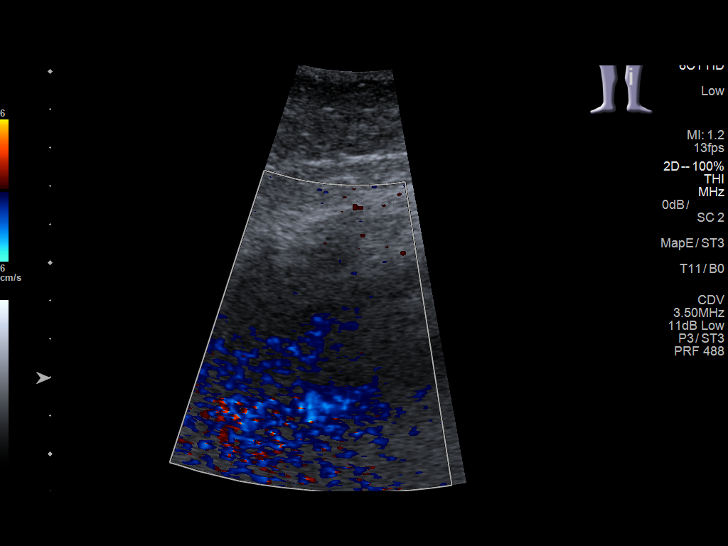

[13 of 24 positions shown; findings below may reference images not displayed]

FINDINGS: Limited exam because of obese body habitus.

Contralateral Common Femoral Vein: Respiratory phasicity is normal
and symmetric with the symptomatic side. No evidence of thrombus.
Normal compressibility.

Common Femoral Vein: No evidence of thrombus. Normal
compressibility, respiratory phasicity and response to augmentation.

Saphenofemoral Junction: No evidence of thrombus. Normal
compressibility and flow on color Doppler imaging.

Profunda Femoral Vein: No evidence of thrombus. Normal
compressibility and flow on color Doppler imaging.

Femoral Vein: No evidence of thrombus. Normal compressibility,
respiratory phasicity and response to augmentation. Limited
assessment of the distal femoral vein because of body habitus.

Popliteal Vein: No evidence of thrombus. Normal compressibility,
respiratory phasicity and response to augmentation.

Calf Veins: Limited assessment of the calf tibial and peroneal veins
because of body habitus. No large occlusive thrombus.

Superficial Great Saphenous Vein: No evidence of thrombus. Normal
compressibility.

Venous Reflux:  None.

Other Findings:  None.
IMPRESSION: No significant left lower extremity femoral popliteal DVT. Limited
assessment of the calf veins secondary to body habitus.

## 2019-12-13 IMAGING — CR DG KNEE COMPLETE 4+V*L*
4 series · 5 of 5 positions shown · non-contrast
Comparison: None.

CLINICAL DATA: Twisted left knee 10 days ago and heard a pop. Still
c/o pain.

EXAM:
LEFT KNEE - COMPLETE 4+ VIEW

[knee ap]
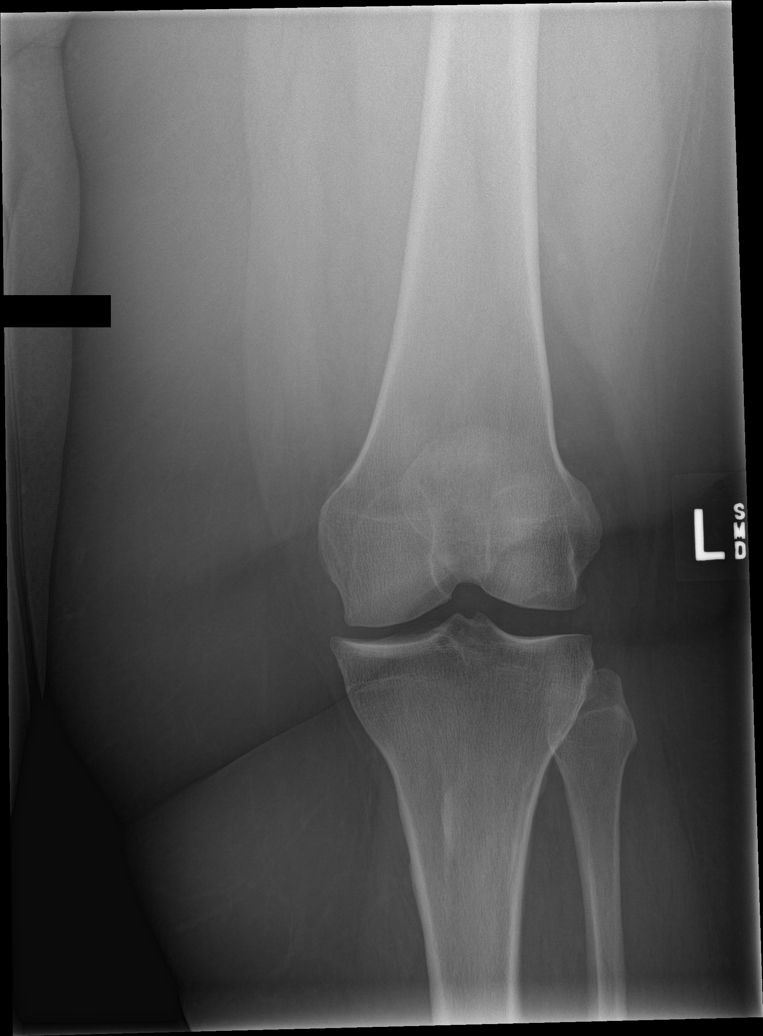

[knee lat]
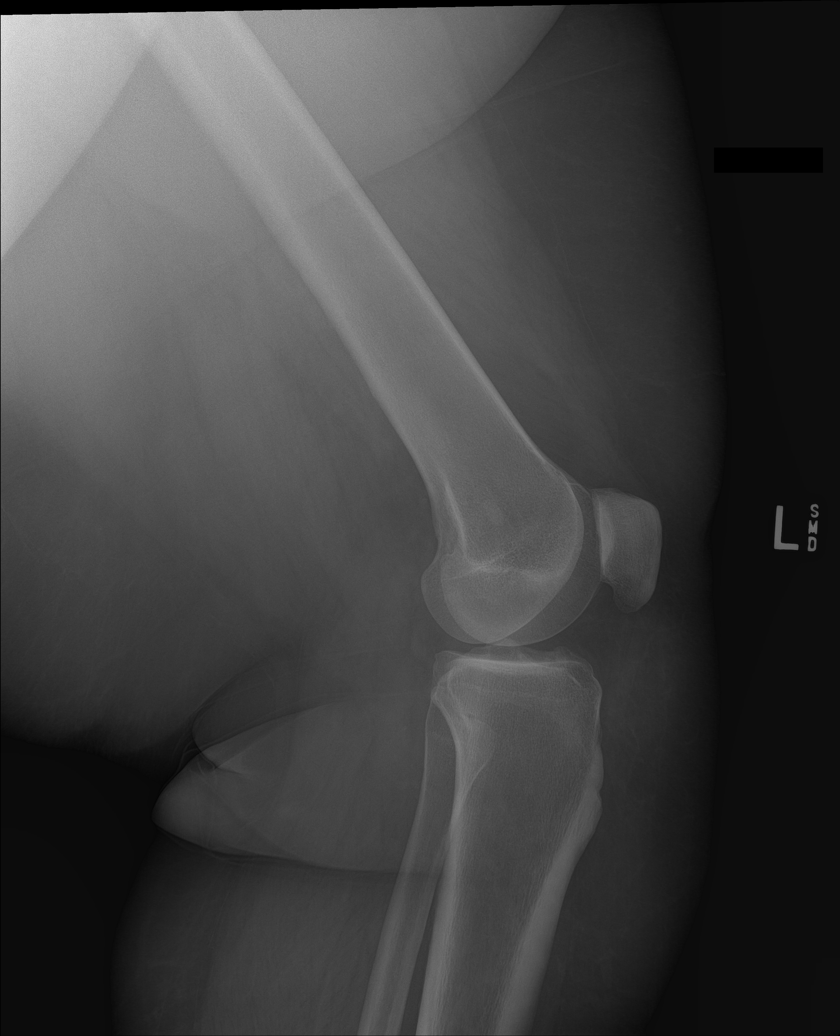

[tunnel]
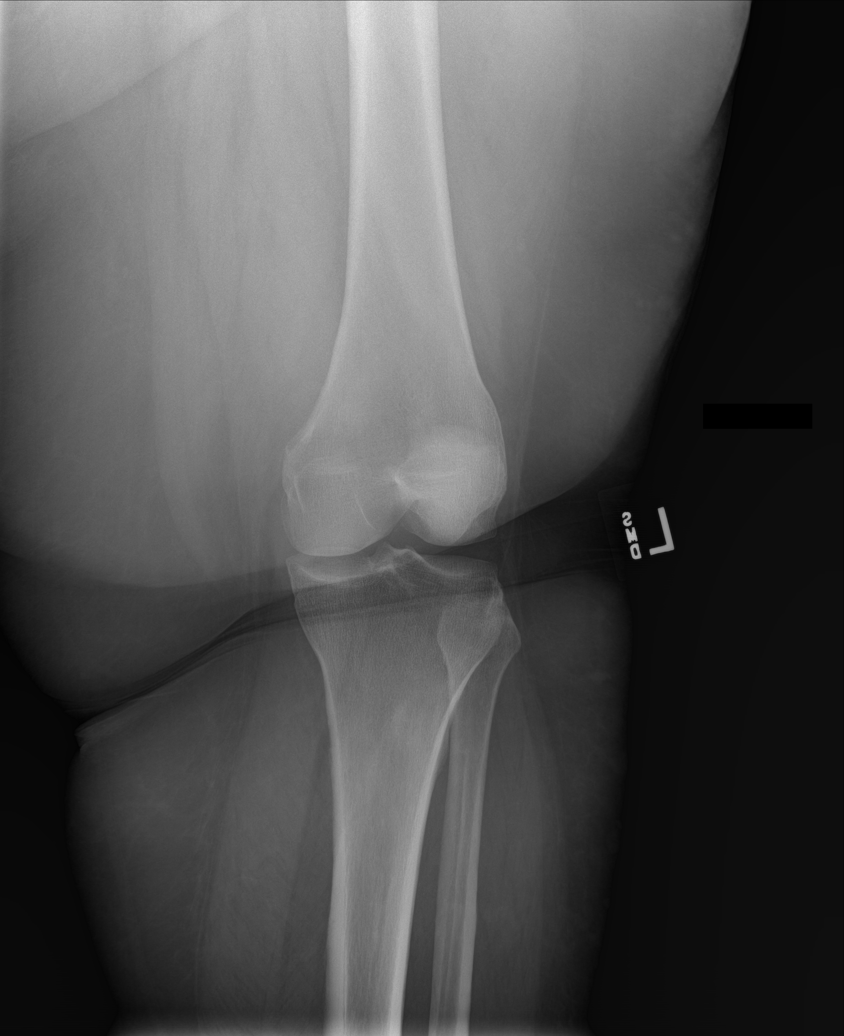

[Series 4: patella skyline · 0.14mm/px · 2 of 2 slices shown]
[im 1/2]
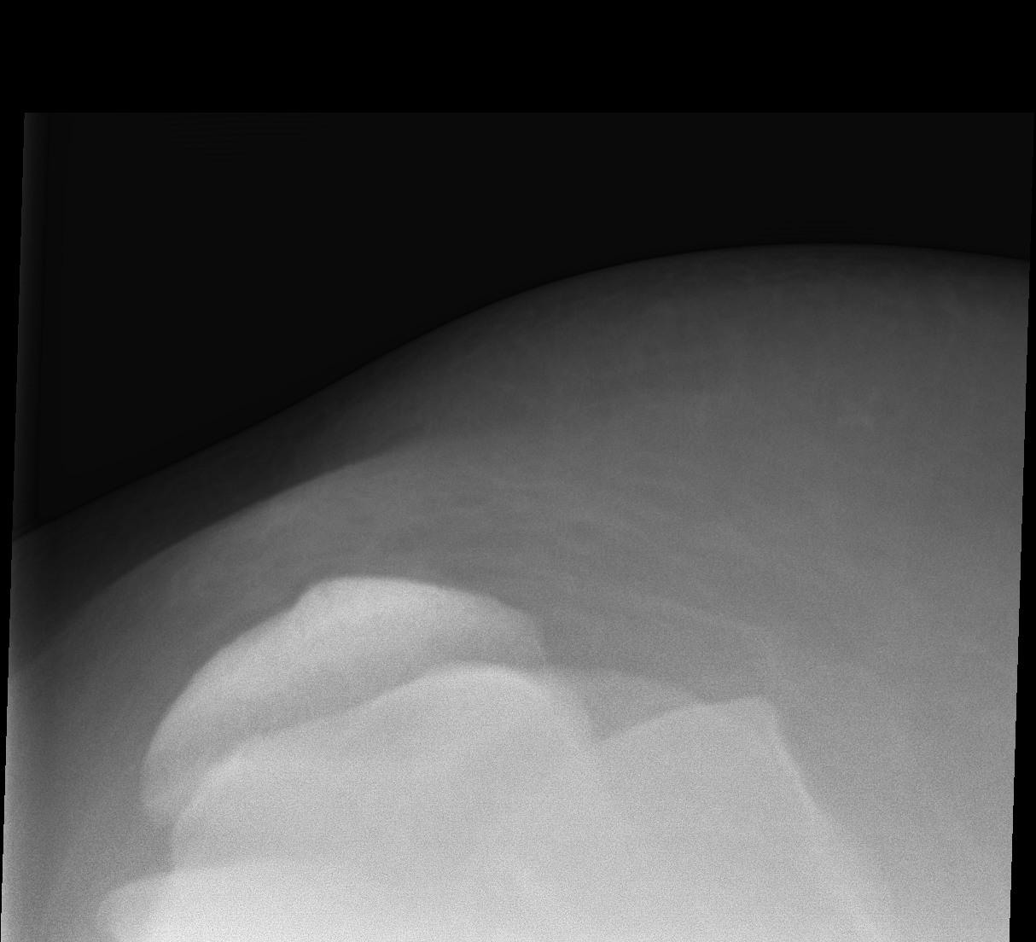
[im 2/2]
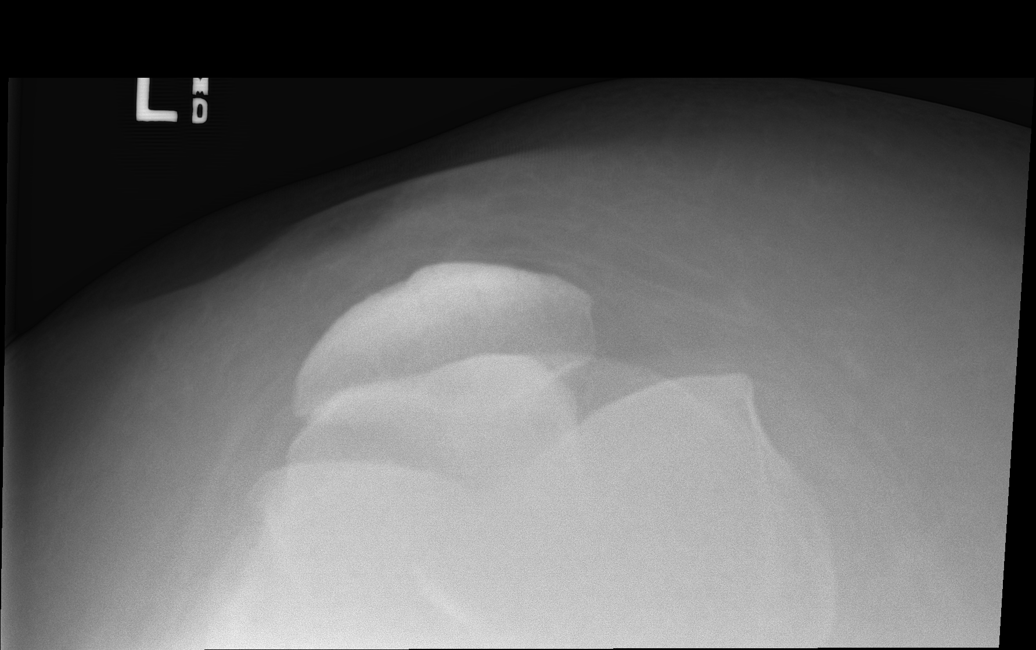

[5 of 5 positions shown; findings below may reference images not displayed]

FINDINGS: No evidence of fracture, dislocation, or joint effusion. No evidence
of arthropathy or other focal bone abnormality. Soft tissues are
unremarkable.
IMPRESSION: Negative.

## 2020-01-01 ENCOUNTER — Encounter: Payer: 59 | Attending: General Surgery | Admitting: Skilled Nursing Facility1

## 2020-01-01 ENCOUNTER — Other Ambulatory Visit: Payer: Self-pay

## 2020-01-01 DIAGNOSIS — E669 Obesity, unspecified: Secondary | ICD-10-CM | POA: Insufficient documentation

## 2020-01-02 NOTE — Progress Notes (Signed)
Bariatric Class:  Appt start time: 6:00 end time: 7:00  12 Month Post-Operative Nutrition Class  Patient was seen on 01/01/2020 for Post-Operative Nutrition education at [facility].   Surgery date: 12/19/2019 Surgery type: RYGB Start weight at [facility]: 456.5 Weight today: pt declined    The following the learning objectives were met by the patient during this course:  Review of TANITA scale information  Share and discuss bariatric surgery successes and non-scale victories  Identifies Phase VII (Maintenance Phase) Dietary Goals which will be lifelong  Identifies appropriate sources of fluids, proteins, non-starchy vegetables, and complex carbohydrates  Identifies well-balanced meals  Identifies portion control   Identifies appropriate multivitamin and calcium sources post-operatively  Describes the need for physical activity post-operatively and will follow MD recommendations  Identifies and describes SMART goals   Creates at least 2 SMART goals to begin immediately  States when to call healthcare provider regarding medication questions or post-operative complications  Teaching method utilized: Visual & Auditory  Demonstrated degree of understanding via: Teach Back  Readiness Level: Action Barriers to learning/adherence to lifestyle change: None Identified  Handouts given during class include:  Phase VII: Maintenance Phase-Lifelong  Follow-Up Plan: Patient will follow-up at [facility] for on-going post-op nutrition visits.

## 2020-02-03 ENCOUNTER — Other Ambulatory Visit: Payer: Self-pay | Admitting: Internal Medicine

## 2020-02-05 MED ORDER — FLUTICASONE PROPIONATE 50 MCG/ACT NA SUSP: NASAL | 12 refills | Status: DC

## 2020-02-05 NOTE — Addendum Note (Signed)
Addended by: Eual Fines on: 02/05/2020 07:41 AM   Modules accepted: Orders

## 2020-04-02 ENCOUNTER — Telehealth: Payer: 59 | Admitting: Family

## 2020-04-02 DIAGNOSIS — J069 Acute upper respiratory infection, unspecified: Secondary | ICD-10-CM | POA: Diagnosis not present

## 2020-04-02 MED ORDER — BENZONATATE 100 MG PO CAPS: 100.0000 mg | ORAL_CAPSULE | Freq: Three times a day (TID) | ORAL | 0 refills | Status: DC | PRN

## 2020-04-02 MED ORDER — FLUTICASONE PROPIONATE 50 MCG/ACT NA SUSP: 2.0000 | Freq: Every day | NASAL | 6 refills | Status: AC

## 2020-04-02 NOTE — Progress Notes (Signed)

## 2020-04-25 IMAGING — CR CHEST - 2 VIEW
2 series · 2 of 2 positions shown · non-contrast
Comparison: None.

CLINICAL DATA: Bariatric workup.  No current chest complaints.

EXAM:
CHEST - 2 VIEW

[chest pa]
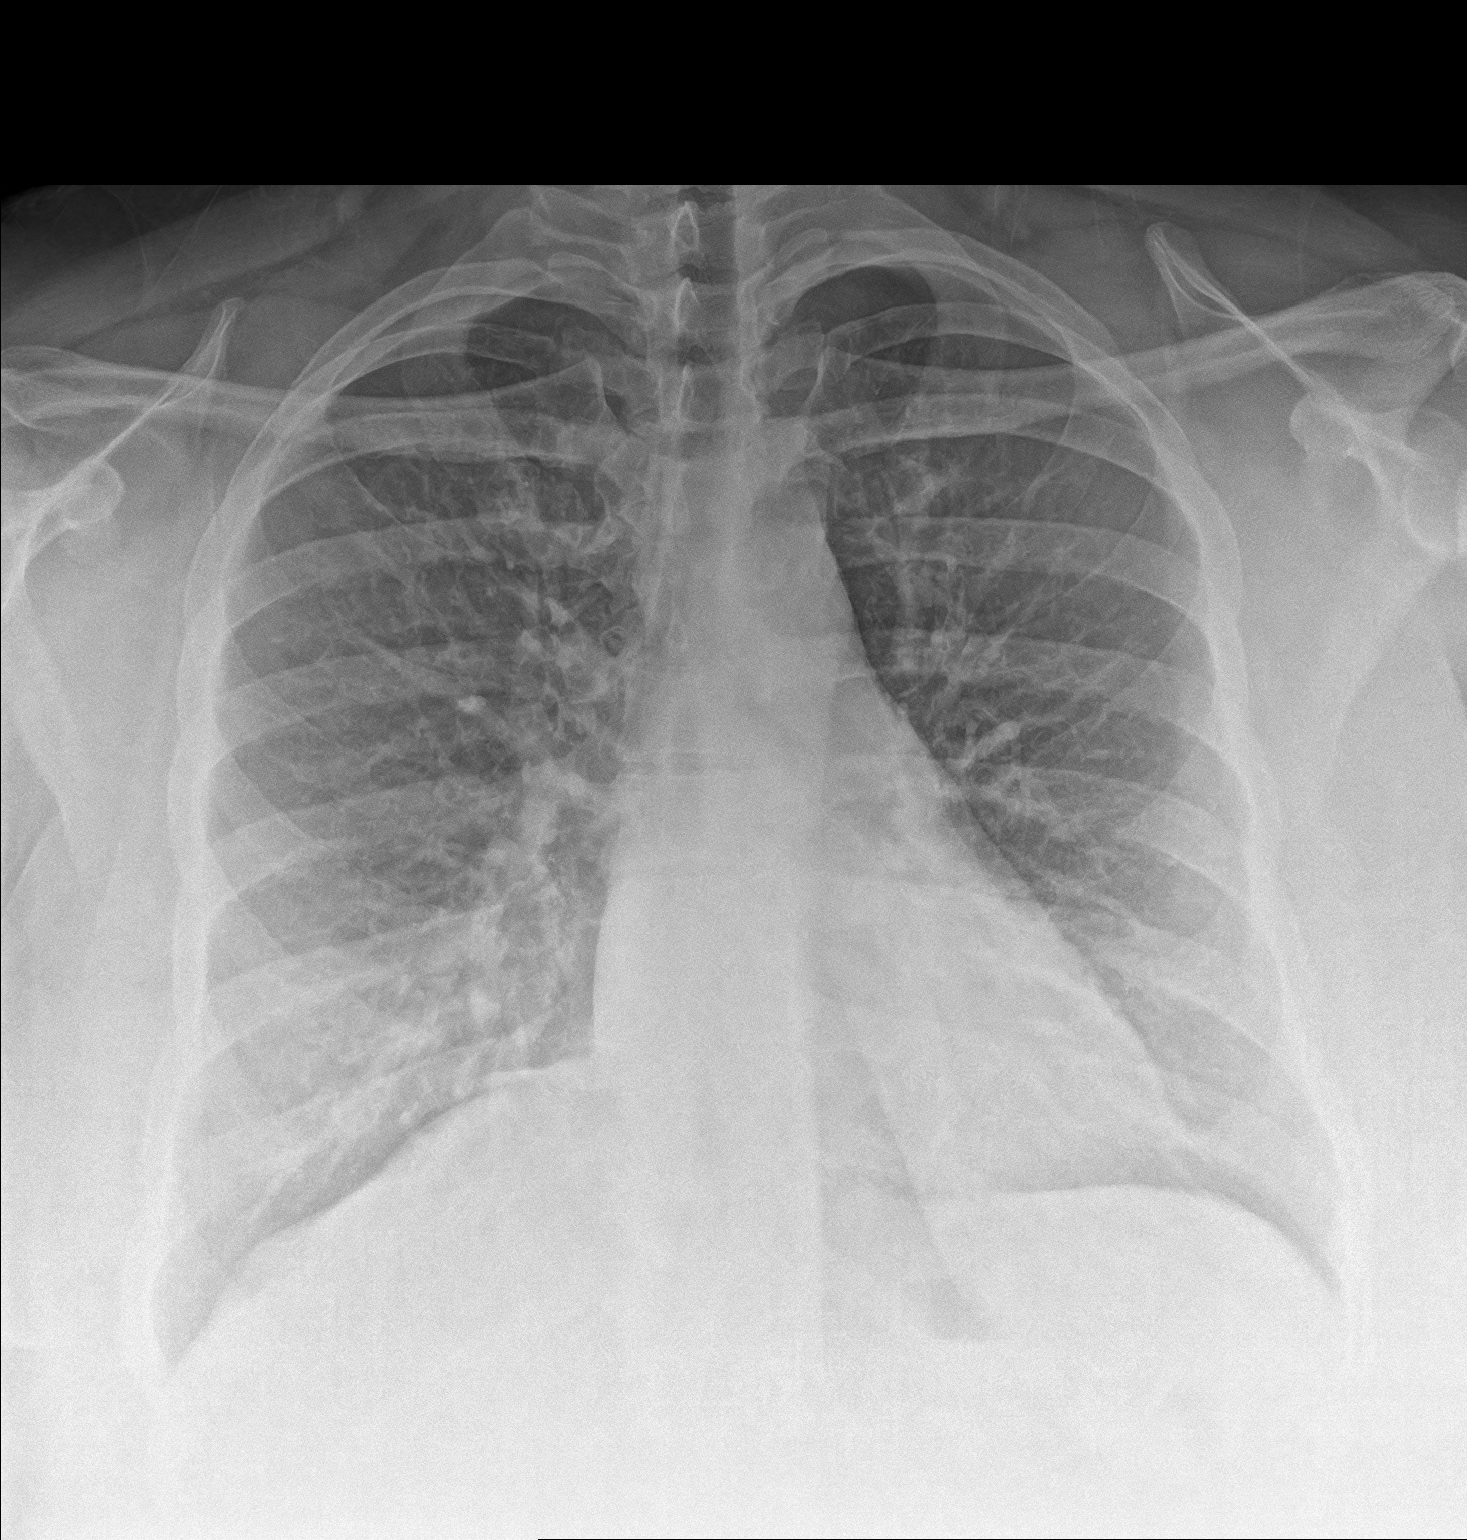

[chest lat]
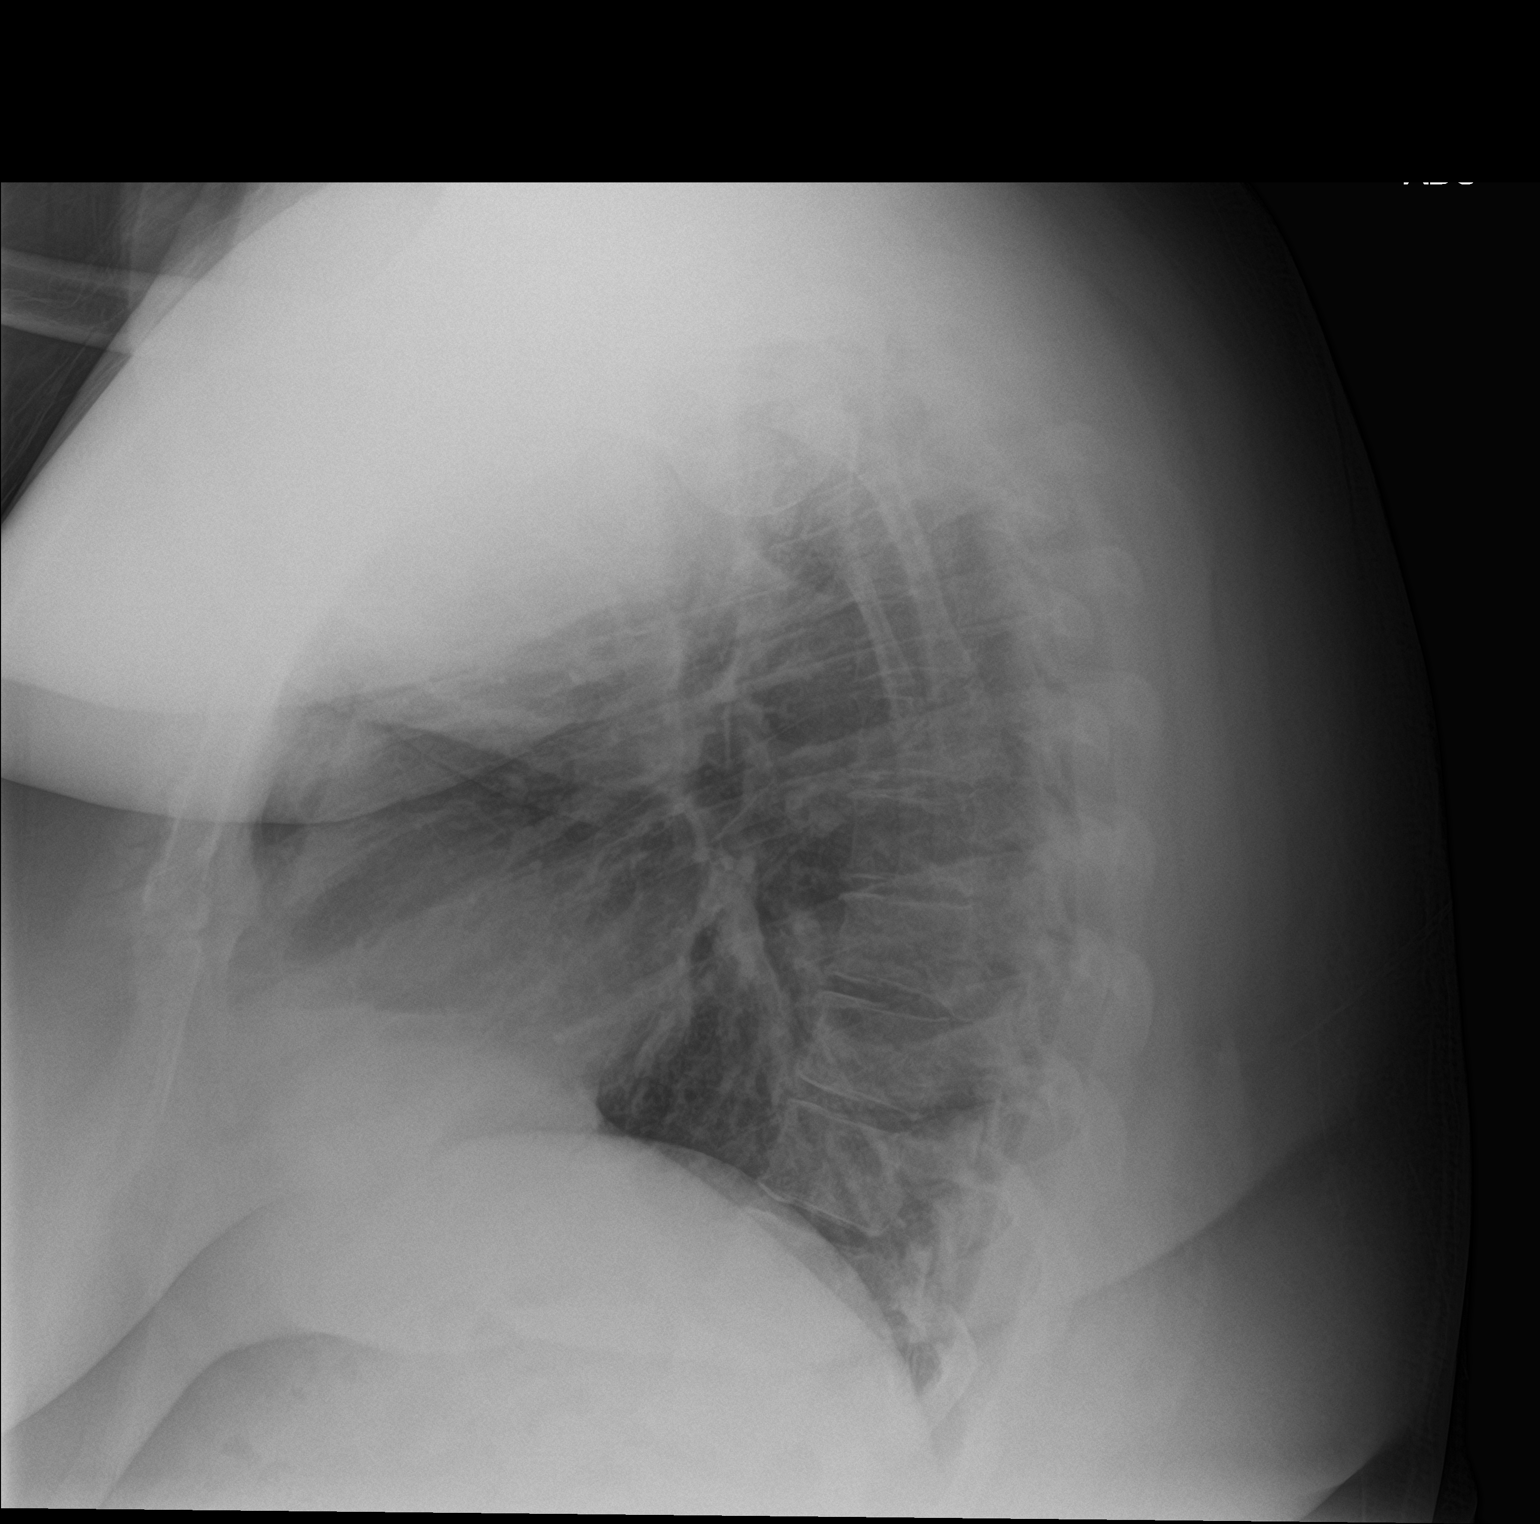

[2 of 2 positions shown; findings below may reference images not displayed]

FINDINGS: The heart size and mediastinal contours are within normal limits.
Both lungs are clear. The visualized skeletal structures are
unremarkable.
IMPRESSION: No active cardiopulmonary disease.

## 2020-06-10 MED ORDER — MONTELUKAST SODIUM 10 MG PO TABS: 10.0000 mg | ORAL_TABLET | Freq: Every day | ORAL | 0 refills | Status: DC

## 2020-06-10 MED ORDER — ALPRAZOLAM 0.25 MG PO TABS: 0.2500 mg | ORAL_TABLET | Freq: Two times a day (BID) | ORAL | 0 refills | Status: DC | PRN

## 2020-06-10 NOTE — Telephone Encounter (Signed)
Alprazolam last filled 11-22-17 #30 Last OV 06-19-19 Next OV 06-24-20 [facility]

## 2020-06-10 NOTE — Addendum Note (Signed)
Addended by: Tillman Abide I on: 06/10/2020 12:54 PM   Modules accepted: Orders

## 2020-06-10 NOTE — Addendum Note (Signed)
Addended by: Eual Fines on: 06/10/2020 10:36 AM   Modules accepted: Orders

## 2020-06-19 ENCOUNTER — Encounter: Payer: 59 | Admitting: Internal Medicine

## 2020-06-24 ENCOUNTER — Encounter: Payer: 59 | Admitting: Internal Medicine

## 2020-06-24 ENCOUNTER — Other Ambulatory Visit: Payer: Self-pay | Admitting: Internal Medicine

## 2020-07-16 ENCOUNTER — Other Ambulatory Visit: Payer: Self-pay | Admitting: Internal Medicine

## 2020-09-09 ENCOUNTER — Encounter: Payer: 59 | Admitting: Internal Medicine

## 2020-10-17 ENCOUNTER — Other Ambulatory Visit: Payer: Self-pay | Admitting: Internal Medicine

## 2020-11-05 ENCOUNTER — Other Ambulatory Visit: Payer: Self-pay

## 2020-11-05 MED ORDER — ALBUTEROL SULFATE HFA 108 (90 BASE) MCG/ACT IN AERS: 1.0000 | INHALATION_SPRAY | Freq: Four times a day (QID) | RESPIRATORY_TRACT | 1 refills | Status: DC | PRN

## 2020-12-01 ENCOUNTER — Other Ambulatory Visit: Payer: Self-pay | Admitting: Internal Medicine

## 2020-12-31 ENCOUNTER — Other Ambulatory Visit: Payer: Self-pay | Admitting: Internal Medicine

## 2021-01-23 DIAGNOSIS — N979 Female infertility, unspecified: Secondary | ICD-10-CM | POA: Diagnosis not present

## 2021-02-11 ENCOUNTER — Ambulatory Visit (INDEPENDENT_AMBULATORY_CARE_PROVIDER_SITE_OTHER): Payer: 59 | Admitting: Internal Medicine

## 2021-02-11 ENCOUNTER — Other Ambulatory Visit: Payer: Self-pay

## 2021-02-11 ENCOUNTER — Encounter: Payer: Self-pay | Admitting: Internal Medicine

## 2021-02-11 DIAGNOSIS — R69 Illness, unspecified: Secondary | ICD-10-CM | POA: Diagnosis not present

## 2021-02-11 DIAGNOSIS — J339 Nasal polyp, unspecified: Secondary | ICD-10-CM | POA: Diagnosis not present

## 2021-02-11 DIAGNOSIS — L989 Disorder of the skin and subcutaneous tissue, unspecified: Secondary | ICD-10-CM | POA: Insufficient documentation

## 2021-02-11 DIAGNOSIS — F324 Major depressive disorder, single episode, in partial remission: Secondary | ICD-10-CM

## 2021-02-11 MED ORDER — DULOXETINE HCL 30 MG PO CPEP: 30.0000 mg | ORAL_CAPSULE | Freq: Every day | ORAL | 3 refills | Status: DC

## 2021-02-11 MED ORDER — LIRAGLUTIDE 18 MG/3ML ~~LOC~~ SOPN: 0.6000 mg | PEN_INJECTOR | Freq: Every day | SUBCUTANEOUS | 3 refills | Status: DC

## 2021-02-11 MED ORDER — FLUCONAZOLE 150 MG PO TABS: 150.0000 mg | ORAL_TABLET | Freq: Every day | ORAL | 1 refills | Status: DC

## 2021-02-11 NOTE — Progress Notes (Signed)
Subjective:    Patient ID: Robyn Jennings, female    DOB: Mar 13, 1980, 41 y.o.   MRN: [id number]  HPI Here for physical and has multiple concerns  Has been going for fertility testing, etc Has done 2 rounds of clomid with IUI procedure---no success Putting this on hold--"too emotionally taxing" Had partner and artifical insemination didn't work Then tried through Teacher, music (no partner now) Other stress with 1 year since dad's death  Has kept weight off---interested in injection therapy No GI symptoms Eating small quantities since surgery--eating more often Appetite is good  Has been generally satisfied with the lexapro---but now having more stress and wonders if she can augment it Is in therapy She felt 20mg  was too much for her Is sleeping okay--maybe a lot Had lost job after dad's death 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 Uses claritin prn  She doesn't notice any difference in depression since on the montelukast  Current Outpatient Medications on File Prior to Visit  Medication Sig Dispense Refill   albuterol (VENTOLIN HFA) 108 (90 Base) MCG/ACT inhaler Inhale 1 puff into the lungs 4 (four) times daily as needed. 18 g 1   ALPRAZolam (XANAX) 0.25 MG tablet Take 1 tablet (0.25 mg total) by mouth 2 (two) times daily as needed for anxiety. 30 tablet 0   escitalopram (LEXAPRO) 10 MG tablet TAKE 1 TABLET (10 MG TOTAL) BY MOUTH DAILY. NEEDS OFFICE VISIT 90 tablet 0   fluticasone (FLONASE) 50 MCG/ACT nasal spray Place 2 sprays into both nostrils daily. 16 g 6   ketoconazole (NIZORAL) 2 % cream Apply 1 application topically daily. 45 g 1   montelukast (SINGULAIR) 10 MG tablet Take 1 tablet (10 mg total) by mouth at  bedtime. 90 tablet 0   Multiple Vitamins-Minerals (WOMENS MULTIVITAMIN) TABS Take 1 tablet by mouth daily.      No current facility-administered medications on file prior to visit.    Allergies  Allergen Reactions   Other Anaphylaxis    Nuts--All Types   Bupropion Other (See Comments)    Made pt feel lethargic    Cetirizine Palpitations   Venlafaxine Palpitations    Past Medical History:  Diagnosis Date   Asthma    seasonal well controlled   Family history of adverse reaction to anesthesia    grandmother took days to "wake up"   GERD (gastroesophageal reflux disease)    Major depression in partial remission (HCC)    Morbid obesity (HCC)    Nasal polyposis    Obstructive sleep apnea    no C-Pap    Past Surgical History:  Procedure Laterality Date   ANKLE SURGERY Left 2003   drilling for new collagen   EYE SURGERY Bilateral    PRK   FRACTURE SURGERY     LAPAROSCOPIC ROUX-EN-Y GASTRIC BYPASS WITH HIATAL HERNIA REPAIR N/A 12/19/2018   Procedure: LAPAROSCOPIC ROUX-EN-Y GASTRIC BYPASS WITH HIATAL HERNIA REPAIR, Upper Endo-ERAS Pathway;  Surgeon: 14/08/2018, MD;  Location: [facility];  Service: General;  Laterality: N/A;   NASAL POLYP EXCISION      Family History  Problem Relation Age of Onset   Lupus Father    Heart disease Father    Congestive Heart Failure Father  Lupus Brother    Diabetes Maternal Grandmother    Heart disease Paternal Grandmother    Cancer Neg Hx     Social History   Socioeconomic History   Marital status: Single    Spouse name: Not on file   Number of children: 0   Years of education: Not on file   Highest education level: Not on file  Occupational History   Occupation: Full time nanny  Tobacco Use   Smoking status: Never   Smokeless tobacco: Never  Vaping Use   Vaping Use: Never used  Substance and Sexual Activity   Alcohol use: Not Currently   Drug use: Never   Sexual activity: Not on file  Other Topics Concern   Not on file   Social History Narrative   Not on file   Social Determinants of Health   Financial Resource Strain: Not on file  Food Insecurity: Not on file  Transportation Needs: Not on file  Physical Activity: Not on file  Stress: Not on file  Social Connections: Not on file  Intimate Partner Violence: Not on file   Review of Systems Has kept her weight off Bowels move fine No urinary problems Some back pain---relates to being up and down with baby all the time     Objective:   Physical Exam Constitutional:      Appearance: Normal appearance.  Skin:    Comments: Irregular macular hyperpigmented area on left lateral/anterior neck No flaking No raised border   Neurological:     Mental Status: She is alert.  Psychiatric:        Mood and Affect: Mood normal.        Behavior: Behavior normal.           Assessment & Plan:

## 2021-02-11 NOTE — Assessment & Plan Note (Signed)
Has kept off over 100# since bariatric surgery Will go ahead and try liraglutide and titrate

## 2021-02-11 NOTE — Assessment & Plan Note (Signed)
Will allergies Will continue flonase and montelukast (didn't make depression worse) claritin prn

## 2021-02-11 NOTE — Assessment & Plan Note (Addendum)
Has had a lot of stress---losing father, changing jobs, failed fertility treatments, etc Didn't tolerate higher lexapro Will add duloxetine 30mg   daily for augmentation

## 2021-02-11 NOTE — Assessment & Plan Note (Signed)
Not neoplastic or infection Better with HC cream but persists Will try 1 week of fluconazole (topical keto burned)

## 2021-02-25 ENCOUNTER — Encounter (HOSPITAL_BASED_OUTPATIENT_CLINIC_OR_DEPARTMENT_OTHER): Payer: Self-pay

## 2021-02-25 ENCOUNTER — Encounter: Payer: Self-pay | Admitting: Internal Medicine

## 2021-02-25 ENCOUNTER — Other Ambulatory Visit: Payer: Self-pay

## 2021-02-25 ENCOUNTER — Emergency Department (HOSPITAL_BASED_OUTPATIENT_CLINIC_OR_DEPARTMENT_OTHER)
Admission: EM | Admit: 2021-02-25 | Discharge: 2021-02-25 | Disposition: A | Discharge status: Home / Self Care | Payer: 59 | Attending: Emergency Medicine | Admitting: Emergency Medicine

## 2021-02-25 DIAGNOSIS — Z5321 Procedure and treatment not carried out due to patient leaving prior to being seen by health care provider: Secondary | ICD-10-CM | POA: Diagnosis not present

## 2021-02-25 DIAGNOSIS — R21 Rash and other nonspecific skin eruption: Secondary | ICD-10-CM | POA: Insufficient documentation

## 2021-02-25 DIAGNOSIS — R220 Localized swelling, mass and lump, head: Secondary | ICD-10-CM | POA: Diagnosis not present

## 2021-02-25 DIAGNOSIS — L299 Pruritus, unspecified: Secondary | ICD-10-CM | POA: Diagnosis not present

## 2021-02-25 NOTE — ED Triage Notes (Signed)
Itching and generalized rash starting one week ago, but over night she has noticed some facial and lip swelling.  Began taking Cymbalta two weeks ago. Denies shortness of breath or throat swelling. Speaks in a clear voice.

## 2021-03-01 ENCOUNTER — Encounter: Payer: Self-pay | Admitting: Internal Medicine

## 2021-03-02 MED ORDER — EPINEPHRINE 0.3 MG/0.3ML IJ SOAJ
0.3000 mg | INTRAMUSCULAR | 1 refills | Status: AC | PRN
Start: 2021-03-02 — End: ?

## 2021-03-27 DIAGNOSIS — L298 Other pruritus: Secondary | ICD-10-CM | POA: Diagnosis not present

## 2021-03-27 DIAGNOSIS — L309 Dermatitis, unspecified: Secondary | ICD-10-CM | POA: Diagnosis not present

## 2021-04-27 ENCOUNTER — Ambulatory Visit (INDEPENDENT_AMBULATORY_CARE_PROVIDER_SITE_OTHER): Payer: 59 | Admitting: Internal Medicine

## 2021-04-27 ENCOUNTER — Encounter: Payer: Self-pay | Admitting: Internal Medicine

## 2021-04-27 VITALS — BP 116/74 | HR 57 | Ht 67.0 in | Wt 345.0 lb

## 2021-04-27 DIAGNOSIS — F3341 Major depressive disorder, recurrent, in partial remission: Secondary | ICD-10-CM | POA: Diagnosis not present

## 2021-04-27 DIAGNOSIS — Z0000 Encounter for general adult medical examination without abnormal findings: Secondary | ICD-10-CM

## 2021-04-27 DIAGNOSIS — R69 Illness, unspecified: Secondary | ICD-10-CM | POA: Diagnosis not present

## 2021-04-27 NOTE — Assessment & Plan Note (Signed)
Healthy ?Will be due for flu vaccine in the fall---and probably bivalent COVID ?Pap done 2021---due in either 3-5 years ?Working on fitness ?

## 2021-04-27 NOTE — Addendum Note (Signed)
Addended by: Alvina Chou on: 04/27/2021 03:00 PM ? ? Modules accepted: Orders ? ?

## 2021-04-27 NOTE — Progress Notes (Signed)
? ?Subjective:  ? ? Patient ID: Robyn Jennings, female    DOB: [birth year], 41 y.o.   MRN: [id number] ? ?HPI ?Here for physical ? ?Wasn't able to get the liraglutide-so not taking ? ?Depression is better now ?No problems adding the duloxetine ? ?Did see dermatologist for rash--thinks it was allergic reaction to sunflower seeds ? ?Still working full time as Social worker (self employed) ? ?Current Outpatient Medications on File Prior to Visit  ?Medication Sig Dispense Refill  ? albuterol (VENTOLIN HFA) 108 (90 Base) MCG/ACT inhaler Inhale 1 puff into the lungs 4 (four) times daily as needed. 18 g 1  ? ALPRAZolam (XANAX) 0.25 MG tablet Take 1 tablet (0.25 mg total) by mouth 2 (two) times daily as needed for anxiety. 30 tablet 0  ? clomiPHENE (CLOMID) 50 MG tablet Take 50 mg by mouth daily.    ? cyanocobalamin 1000 MCG tablet Take by mouth.    ? DULoxetine (CYMBALTA) 30 MG capsule Take 1 capsule (30 mg total) by mouth daily. 30 capsule 3  ? EPINEPHrine 0.3 mg/0.3 mL IJ SOAJ injection Inject 0.3 mg into the muscle as needed for anaphylaxis. 2 each 1  ? escitalopram (LEXAPRO) 10 MG tablet TAKE 1 TABLET (10 MG TOTAL) BY MOUTH DAILY. NEEDS OFFICE VISIT 90 tablet 0  ? ferrous sulfate 325 (65 FE) MG tablet Take by mouth.    ? fluticasone (FLONASE) 50 MCG/ACT nasal spray Place 2 sprays into both nostrils daily. 16 g 6  ? ketoconazole (NIZORAL) 2 % cream Apply 1 application topically daily. 45 g 1  ? LORATADINE ALLERGY RELIEF PO Take by mouth.    ? Multiple Vitamins-Minerals (WOMENS MULTIVITAMIN) TABS Take 1 tablet by mouth daily.     ? triamcinolone cream (KENALOG) 0.1 % Apply topically 2 (two) times daily.    ? ?No current facility-administered medications on file prior to visit.  ? ? ?Allergies  ?Allergen Reactions  ? Other Anaphylaxis  ?  Nuts--All Types  ? Bupropion Other (See Comments)  ?  Made pt feel lethargic   ? Cetirizine Palpitations  ? Venlafaxine Palpitations  ? ? ?Past Medical History:  ?Diagnosis Date  ? Asthma   ? seasonal  well controlled  ? Family history of adverse reaction to anesthesia   ? grandmother took days to "wake up"  ? GERD (gastroesophageal reflux disease)   ? Major depression in partial remission (HCC)   ? Morbid obesity (HCC)   ? Nasal polyposis   ? Obstructive sleep apnea   ? no C-Pap  ? ? ?Past Surgical History:  ?Procedure Laterality Date  ? ANKLE SURGERY Left 2003  ? drilling for new collagen  ? EYE SURGERY Bilateral   ? PRK  ? FRACTURE SURGERY    ? LAPAROSCOPIC ROUX-EN-Y GASTRIC BYPASS WITH HIATAL HERNIA REPAIR N/A 12/19/2018  ? Procedure: LAPAROSCOPIC ROUX-EN-Y GASTRIC BYPASS WITH HIATAL HERNIA REPAIR, Upper Endo-ERAS Pathway;  Surgeon: [name], MD;  Location: [facility];  Service: General;  Laterality: N/A;  ? NASAL POLYP EXCISION    ? ? ?Family History  ?Problem Relation Age of Onset  ? Lupus Father   ? Heart disease Father   ? Congestive Heart Failure Father   ? Lupus Brother   ? Diabetes Maternal Grandmother   ? Heart disease Paternal Grandmother   ? Cancer Neg Hx   ? ? ?Social History  ? ?Socioeconomic History  ? Marital status: Single  ?  Spouse name: Not on file  ? Number of children: 0  ?  Years of education: Not on file  ? Highest education level: Not on file  ?Occupational History  ? Occupation: Full time nanny  ?Tobacco Use  ? Smoking status: Never  ? Smokeless tobacco: Never  ?Vaping Use  ? Vaping Use: Never used  ?Substance and Sexual Activity  ? Alcohol use: Yes  ?  Comment: occasional  ? Drug use: Never  ? Sexual activity: Not on file  ?Other Topics Concern  ? Not on file  ?Social History Narrative  ? Not on file  ? ?Social Determinants of Health  ? ?Financial Resource Strain: Not on file  ?Food Insecurity: Not on file  ?Transportation Needs: Not on file  ?Physical Activity: Not on file  ?Stress: Not on file  ?Social Connections: Not on file  ?Intimate Partner Violence: Not on file  ? ?Review of Systems  ?Constitutional:  Negative for fatigue.  ?     Does a lot of walking ?Wears seat belt  ?HENT:   Negative for dental problem, hearing loss, tinnitus and trouble swallowing.   ?     Keeps up with dentist  ?Eyes:  Negative for visual disturbance.  ?     No diplopia or unilateral vision loss  ?Respiratory:  Negative for cough, chest tightness and shortness of breath.   ?Cardiovascular:  Negative for chest pain, palpitations and leg swelling.  ?Gastrointestinal:  Negative for blood in stool and constipation.  ?     No heartburn  ?Endocrine: Negative for polydipsia and polyuria.  ?Genitourinary:  Negative for dysuria and hematuria.  ?     Periods are regular ?Discussed condoms  ?Musculoskeletal:  Negative for arthralgias, back pain and joint swelling.  ?Skin:  Negative for rash.  ?Allergic/Immunologic: Positive for environmental allergies. Negative for immunocompromised state.  ?     Controlled with claritin, flonase  ?Neurological:  Negative for dizziness, syncope, light-headedness and headaches.  ?Hematological:  Negative for adenopathy. Does not bruise/bleed easily.  ?Psychiatric/Behavioral:  Negative for sleep disturbance. The patient is nervous/anxious.   ?     Uses xanax occasionally  ? ?   ?Objective:  ? Physical Exam ?Constitutional:   ?   Appearance: She is obese.  ?HENT:  ?   Mouth/Throat:  ?   Pharynx: No oropharyngeal exudate or posterior oropharyngeal erythema.  ?Eyes:  ?   Conjunctiva/sclera: Conjunctivae normal.  ?   Pupils: Pupils are equal, round, and reactive to light.  ?Cardiovascular:  ?   Rate and Rhythm: Normal rate and regular rhythm.  ?   Pulses: Normal pulses.  ?   Heart sounds: No murmur heard. ?  No gallop.  ?Pulmonary:  ?   Effort: Pulmonary effort is normal.  ?   Breath sounds: Normal breath sounds.  ?Abdominal:  ?   Palpations: Abdomen is soft.  ?   Tenderness: There is no abdominal tenderness.  ?Musculoskeletal:  ?   Cervical back: Neck supple.  ?   Right lower leg: No edema.  ?   Left lower leg: No edema.  ?Lymphadenopathy:  ?   Cervical: No cervical adenopathy.  ?Skin: ?   Findings:  No lesion.  ?Neurological:  ?   General: No focal deficit present.  ?   Mental Status: She is alert and oriented to person, place, and time.  ?Psychiatric:     ?   Mood and Affect: Mood normal.     ?   Behavior: Behavior normal.  ?  ? ? ? ? ?   ?Assessment &  Plan:  ? ?

## 2021-04-27 NOTE — Assessment & Plan Note (Signed)
Doing better now on escitalpram 10 and duloxetine 30 ?If worsens again, will increase duloxetine to 60 ?

## 2021-04-27 NOTE — Assessment & Plan Note (Signed)
Has kept off well over 100# since bypass ?Will check labs ?

## 2021-04-28 ENCOUNTER — Other Ambulatory Visit: Payer: Self-pay | Admitting: Internal Medicine

## 2021-04-28 LAB — COMPREHENSIVE METABOLIC PANEL
ALT: 17 U/L (ref 0–35)
AST: 16 U/L (ref 0–37)
Albumin: 3.7 g/dL (ref 3.5–5.2)
Alkaline Phosphatase: 73 U/L (ref 39–117)
BUN: 14 mg/dL (ref 6–23)
CO2: 28 mEq/L (ref 19–32)
Calcium: 8.9 mg/dL (ref 8.4–10.5)
Chloride: 101 mEq/L (ref 96–112)
Creatinine, Ser: 0.8 mg/dL (ref 0.40–1.20)
GFR: 93.3 mL/min (ref >60.00)
Glucose, Bld: 83 mg/dL (ref 70–99)
Potassium: 4.8 mEq/L (ref 3.5–5.1)
Sodium: 136 mEq/L (ref 135–145)
Total Bilirubin: 0.3 mg/dL (ref 0.2–1.2)
Total Protein: 6.8 g/dL (ref 6.0–8.3)

## 2021-04-28 LAB — CBC
HCT: 41.9 % (ref 36.0–46.0)
Hemoglobin: 13.9 g/dL (ref 12.0–15.0)
MCHC: 33.2 g/dL (ref 30.0–36.0)
MCV: 93.7 fl (ref 78.0–100.0)
Platelets: 300 10*3/uL (ref 150.0–400.0)
RBC: 4.47 Mil/uL (ref 3.87–5.11)
RDW: 13 % (ref 11.5–15.5)
WBC: 7.8 10*3/uL (ref 4.0–10.5)

## 2021-04-28 LAB — HEMOGLOBIN A1C: Hgb A1c MFr Bld: 5.3 % (ref 4.6–6.5)

## 2021-04-28 LAB — VITAMIN B12: Vitamin B-12: 652 pg/mL (ref 211–911)

## 2021-04-28 LAB — IBC + FERRITIN
Ferritin: 54.2 ng/mL (ref 10.0–291.0)
Iron: 55 ug/dL (ref 42–145)
Saturation Ratios: 19.6 % — ABNORMAL LOW (ref 20.0–50.0)
TIBC: 280 ug/dL (ref 250.0–450.0)
Transferrin: 200 mg/dL — ABNORMAL LOW (ref 212.0–360.0)

## 2021-04-28 LAB — T4, FREE: Free T4: 0.9 ng/dL (ref 0.60–1.60)

## 2021-04-28 NOTE — Telephone Encounter (Signed)
Is this okay to refill? 

## 2021-06-25 ENCOUNTER — Other Ambulatory Visit: Payer: Self-pay | Admitting: Internal Medicine

## 2021-07-17 ENCOUNTER — Encounter (HOSPITAL_COMMUNITY): Payer: Self-pay | Admitting: *Deleted

## 2021-07-27 ENCOUNTER — Encounter: Payer: Self-pay | Admitting: Internal Medicine

## 2021-07-27 MED ORDER — KETOCONAZOLE 2 % EX CREA
1.0000 | TOPICAL_CREAM | Freq: Every day | CUTANEOUS | 1 refills | Status: AC
Start: 2021-07-27 — End: ?

## 2021-09-03 ENCOUNTER — Other Ambulatory Visit: Payer: Self-pay | Admitting: Internal Medicine

## 2021-09-04 DIAGNOSIS — Z118 Encounter for screening for other infectious and parasitic diseases: Secondary | ICD-10-CM | POA: Diagnosis not present

## 2021-09-04 DIAGNOSIS — Z681 Body mass index (BMI) 19 or less, adult: Secondary | ICD-10-CM | POA: Diagnosis not present

## 2021-09-04 DIAGNOSIS — Z708 Other sex counseling: Secondary | ICD-10-CM | POA: Diagnosis not present

## 2021-09-04 DIAGNOSIS — R69 Illness, unspecified: Secondary | ICD-10-CM | POA: Diagnosis not present

## 2021-09-04 DIAGNOSIS — Z113 Encounter for screening for infections with a predominantly sexual mode of transmission: Secondary | ICD-10-CM | POA: Diagnosis not present

## 2021-09-04 DIAGNOSIS — N898 Other specified noninflammatory disorders of vagina: Secondary | ICD-10-CM | POA: Diagnosis not present

## 2021-09-04 DIAGNOSIS — Z1159 Encounter for screening for other viral diseases: Secondary | ICD-10-CM | POA: Diagnosis not present

## 2021-09-04 DIAGNOSIS — Z114 Encounter for screening for human immunodeficiency virus [HIV]: Secondary | ICD-10-CM | POA: Diagnosis not present

## 2021-10-29 ENCOUNTER — Other Ambulatory Visit: Payer: Self-pay | Admitting: Internal Medicine

## 2021-10-29 MED ORDER — ALPRAZOLAM 0.25 MG PO TABS: 0.2500 mg | ORAL_TABLET | Freq: Two times a day (BID) | ORAL | 0 refills | Status: DC | PRN

## 2021-10-29 NOTE — Telephone Encounter (Signed)
Name of Medication:alprazolam 0.25 mg Name of Pharmacy:[facility]  Last Fill or Written Date and Quantity: # 30 on 06/10/20 Last Office Visit and Type: 04/27/2021 annual exam Next Office Visit and Type: none scheduled   Since pt last filled alprazolam 0.25 mg 06/10/20 I left v/m for pt to call [facility] to ask if there is a reason need to restart the alprazolam. Sending note to [facility] CMA and Dr Silvio Pate.

## 2021-11-01 DIAGNOSIS — Z23 Encounter for immunization: Secondary | ICD-10-CM | POA: Diagnosis not present

## 2021-11-08 DIAGNOSIS — Z20822 Contact with and (suspected) exposure to covid-19: Secondary | ICD-10-CM | POA: Diagnosis not present

## 2021-11-08 DIAGNOSIS — R109 Unspecified abdominal pain: Secondary | ICD-10-CM | POA: Diagnosis not present

## 2021-11-08 DIAGNOSIS — J020 Streptococcal pharyngitis: Secondary | ICD-10-CM | POA: Diagnosis not present

## 2022-01-08 DIAGNOSIS — Z23 Encounter for immunization: Secondary | ICD-10-CM | POA: Diagnosis not present

## 2022-01-10 ENCOUNTER — Ambulatory Visit (HOSPITAL_COMMUNITY)
Admission: EM | Admit: 2022-01-10 | Discharge: 2022-01-10 | Disposition: A | Discharge status: Home / Self Care | Payer: 59 | Attending: Emergency Medicine | Admitting: Emergency Medicine

## 2022-01-10 ENCOUNTER — Encounter (HOSPITAL_COMMUNITY): Payer: Self-pay

## 2022-01-10 DIAGNOSIS — T7840XA Allergy, unspecified, initial encounter: Secondary | ICD-10-CM

## 2022-01-10 MED ORDER — PREDNISONE 10 MG PO TABS: ORAL_TABLET | ORAL | 0 refills | Status: DC

## 2022-01-10 MED ORDER — DIPHENHYDRAMINE HCL 25 MG PO TABS: 25.0000 mg | ORAL_TABLET | Freq: Four times a day (QID) | ORAL | 0 refills | Status: AC | PRN

## 2022-01-10 MED ORDER — METHYLPREDNISOLONE SODIUM SUCC 125 MG IJ SOLR
80.0000 mg | Freq: Once | INTRAMUSCULAR | Status: AC
  Administered 2022-01-10: 125 mg via INTRAMUSCULAR

## 2022-01-10 MED ORDER — FAMOTIDINE 20 MG PO TABS
ORAL_TABLET | ORAL | Status: AC
  Filled 2022-01-10: qty 1

## 2022-01-10 MED ORDER — FAMOTIDINE 20 MG PO TABS
20.0000 mg | ORAL_TABLET | Freq: Once | ORAL | Status: AC
  Administered 2022-01-10: 20 mg via ORAL

## 2022-01-10 MED ORDER — ALBUTEROL SULFATE (2.5 MG/3ML) 0.083% IN NEBU
2.5000 mg | INHALATION_SOLUTION | Freq: Once | RESPIRATORY_TRACT | Status: AC
  Administered 2022-01-10: 2.5 mg via RESPIRATORY_TRACT

## 2022-01-10 MED ORDER — METHYLPREDNISOLONE SODIUM SUCC 125 MG IJ SOLR
INTRAMUSCULAR | Status: AC
  Filled 2022-01-10: qty 2

## 2022-01-10 MED ORDER — ALBUTEROL SULFATE (2.5 MG/3ML) 0.083% IN NEBU
INHALATION_SOLUTION | RESPIRATORY_TRACT | Status: AC
  Filled 2022-01-10: qty 3

## 2022-01-10 NOTE — ED Notes (Signed)
Pt tolerated injection of solu-medrol well.

## 2022-01-10 NOTE — ED Provider Notes (Signed)
[facility]    CSN: [id number] Arrival date & time: 01/10/22  1612      History   Chief Complaint Chief Complaint  Patient presents with   Allergic Reaction    HPI Robyn Jennings is a 41 y.o. female.  Patient presents complaining of allergic reaction that started 2 days ago.  She reports that she has a history of nut allergy, she is unsure how she may have come into contact with nuts.  She reports having generalized hives, nonproductive cough, and throat irritation.  She reports having shortness of breath at times.  She is concerned due to having a extensive allergic reaction earlier this year when coming in contact with nuts.  She states that she has used a topical corticosteroid and Claritin with some relief of symptoms. She reports history of asthma, she states that she has used her albuterol inhaler several times with minimal relief of symptoms.   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   Gastric bypass status for obesity 12/19/2018   Preventative health care 06/09/2018   Sebaceous cyst 06/09/2018   Calf pain 03/19/2018   Asthma    Morbid obesity (HCC)    Obstructive sleep apnea    Major depression in partial remission (HCC)    Nasal polyposis     Past Surgical History:  Procedure Laterality Date   ANKLE SURGERY Left 2003   drilling for new collagen   EYE  SURGERY Bilateral    PRK   FRACTURE SURGERY     LAPAROSCOPIC ROUX-EN-Y GASTRIC BYPASS WITH HIATAL HERNIA REPAIR N/A 12/19/2018   Procedure: LAPAROSCOPIC ROUX-EN-Y GASTRIC BYPASS WITH HIATAL HERNIA REPAIR, Upper Endo-ERAS Pathway;  Surgeon: [name], MD;  Location: [facility];  Service: General;  Laterality: N/A;   NASAL POLYP EXCISION      OB History   No obstetric history on file.      Home Medications    Prior to Admission medications   Medication Sig Start Date End Date Taking? Authorizing Provider  diphenhydrAMINE (BENADRYL) 25 MG tablet Take 1 tablet (25 mg total) by mouth every 6 (six) hours as needed. 01/10/22  Yes Debby Freiberg, NP  predniSONE (DELTASONE) 10 MG tablet Take 6 tablets on the first day, 5 tablets on the second day, 4 tablets on the third day, 3 tablets on the fourth day, 2 tablets on the fifth day, and 1 tablet on the last day. 01/10/22  Yes Debby Freiberg, NP  albuterol (VENTOLIN HFA) 108 (90 Base) MCG/ACT inhaler Inhale 1 puff into the lungs 4 (four) times daily as needed. 11/05/20 01/06/22  [name], MD  ALPRAZolam Prudy Feeler) 0.25 MG tablet Take 1 tablet (0.25 mg total) by mouth 2 (two) times daily as needed for anxiety. 10/29/21   [name], MD  cyanocobalamin  1000 MCG tablet Take by mouth.    [provider]  DULoxetine (CYMBALTA) 30 MG capsule TAKE 1 CAPSULE BY MOUTH EVERY DAY 06/25/21   Tillman Abide I, MD  EPINEPHrine 0.3 mg/0.3 mL IJ SOAJ injection Inject 0.3 mg into the muscle as needed for anaphylaxis. 03/02/21   [name], MD  escitalopram (LEXAPRO) 10 MG tablet Take 1 tablet (10 mg total) by mouth daily. 04/28/21   [name], MD  ferrous sulfate 325 (65 FE) MG tablet Take by mouth.    [provider]  fluticasone (FLONASE) 50 MCG/ACT nasal spray Place 2 sprays into both nostrils daily. 04/02/20   Junie Spencer, FNP  ketoconazole (NIZORAL) 2 % cream Apply 1 Application topically daily. 07/27/21    [name], MD  LORATADINE ALLERGY RELIEF PO Take by mouth.    [provider]  Multiple Vitamins-Minerals (WOMENS MULTIVITAMIN) TABS Take 1 tablet by mouth daily.     [provider]  triamcinolone cream (KENALOG) 0.1 % Apply topically 2 (two) times daily. 03/27/21   [provider]    Family History Family History  Problem Relation Age of Onset   Lupus Father    Heart disease Father    Congestive Heart Failure Father    Lupus Brother    Diabetes Maternal Grandmother    Heart disease Paternal Grandmother    Cancer Neg Hx     Social History Social History   Tobacco Use   Smoking status: Never   Smokeless tobacco: Never  Vaping Use   Vaping Use: Never used  Substance Use Topics   Alcohol use: Yes    Comment: occasional   Drug use: Never     Allergies   Other, Bupropion, Cetirizine, and Venlafaxine   Review of Systems Review of Systems  Constitutional:  Positive for fatigue. Negative for activity change, chills and fever.  HENT:  Negative for trouble swallowing.        Throat irritation  Eyes: Negative.   Respiratory:  Positive for cough, chest tightness and shortness of breath. Negative for choking, wheezing and stridor.   Cardiovascular:  Negative for chest pain and palpitations.  Gastrointestinal: Negative.   Skin:  Positive for rash (Generalized).       Generalized itching and skin irritation     Physical Exam Triage Vital Signs ED Triage Vitals  Enc Vitals Group     BP 01/10/22 1811 (!) 129/91     Pulse Rate 01/10/22 1811 73     Resp 01/10/22 1811 16     Temp 01/10/22 1811 98 F (36.7 C)     Temp Source 01/10/22 1811 Oral     SpO2 01/10/22 1811 96 %     Weight --      Height --      Head Circumference --      Peak Flow --      Pain Score 01/10/22 1808 0     Pain Loc --      Pain Edu? --      Excl. in GC? --    No data found.  Updated Vital Signs BP (!) 129/91 (BP Location: Right Wrist)   Pulse 73   Temp 98  F (36.7 C) (Oral)   Resp 16   LMP 01/09/2022   SpO2 96%     Physical Exam Vitals and nursing note reviewed.  HENT:     Mouth/Throat:     Mouth: Mucous membranes are moist.  Pharynx: Posterior oropharyngeal erythema present. No pharyngeal swelling, oropharyngeal exudate or uvula swelling.     Tonsils: No tonsillar exudate or tonsillar abscesses. 0 on the right. 0 on the left.     Comments: Epiglottis noted upon exam  Cardiovascular:     Rate and Rhythm: Normal rate and regular rhythm.     Heart sounds: Normal heart sounds, S1 normal and S2 normal.  Pulmonary:     Effort: Pulmonary effort is normal.     Breath sounds: Examination of the right-lower field reveals decreased breath sounds. Examination of the left-lower field reveals decreased breath sounds. Decreased breath sounds present. No wheezing, rhonchi or rales.  Skin:    Findings: Erythema and rash present. Rash is urticarial.     Comments: Generalized urticarial rash on face, torso, upper extremities, and lower extremities.   Neurological:     Mental Status: She is alert.      UC Treatments / Results  Labs (all labs ordered are listed, but only abnormal results are displayed) Labs Reviewed - No data to display  EKG   Radiology No results found.  Procedures Procedures (including critical care time)  Medications Ordered in UC Medications  methylPREDNISolone sodium succinate (SOLU-MEDROL) 125 mg/2 mL injection 80 mg (125 mg Intramuscular Given 01/10/22 1847)  albuterol (PROVENTIL) (2.5 MG/3ML) 0.083% nebulizer solution 2.5 mg (2.5 mg Nebulization Given 01/10/22 1849)  famotidine (PEPCID) tablet 20 mg (20 mg Oral Given 01/10/22 1847)    Initial Impression / Assessment and Plan / UC Course  I have reviewed the triage vital signs and the nursing notes.  Pertinent labs & imaging results that were available during my care of the patient were reviewed by me and considered in my medical decision making (see chart  for details).     Patient was evaluated for allergic reaction.  Benadryl, breathing treatment, and Pepcid was given in office.  Patient experienced some relief of symptoms after medications in office.  Prednisone and Benadryl taper was sent to the pharmacy.  Patient was made aware of management at home with use of antihistamine, topical steroid, and albuterol inhaler.  Patient was made aware of timeline for symptom resolution and when follow-up would be necessary.  Patient was made aware of red flag symptoms that warrant an emergency department visit.  Patient verbalized understanding of instructions.  Charting was provided using a a verbal dictation system, charting was proofread for errors, errors may occur which could change the meaning of the information charted.   Final Clinical Impressions(s) / UC Diagnoses   Final diagnoses:  Allergic reaction, initial encounter     Discharge Instructions      A prednisone taper has been sent to the pharmacy, this is a steroid, start taking this tomorrow morning since we gave you the steroid injection in office today.  Benadryl has been sent to the pharmacy, an antihistamine,  you can use this every 6 hours as needed, reserve this medicine for bedtime due to a side effect of drowsiness.   You may continue taking the Claritin, 1 tablet by mouth daily until your symptoms resolved.  You may continue using the triamcinolone cream, you may use this 2 times a day on affected areas.  You may continue to use your albuterol inhaler every 4-6 hours as needed for shortness of breath.   If symptoms are not improving please present back to this office or follow-up.   If you develop any severe symptoms at home such as difficulty breathing, chest pain,  worsening lightheadedness, palpitations, or any severe/concerning symptoms please go to the nearest emergency department.      ED Prescriptions     Medication Sig Dispense Auth. Provider   predniSONE (DELTASONE)  10 MG tablet Take 6 tablets on the first day, 5 tablets on the second day, 4 tablets on the third day, 3 tablets on the fourth day, 2 tablets on the fifth day, and 1 tablet on the last day. 21 tablet Debby FreibergWedderburn, Garron Eline N, NP   diphenhydrAMINE (BENADRYL) 25 MG tablet Take 1 tablet (25 mg total) by mouth every 6 (six) hours as needed. 30 tablet Debby FreibergWedderburn, Willy Pinkerton N, NP      PDMP not reviewed this encounter.   Debby FreibergWedderburn, Gailen Venne N, NP 01/11/22 1037

## 2022-01-10 NOTE — Discharge Instructions (Addendum)
A prednisone taper has been sent to the pharmacy, this is a steroid, start taking this tomorrow morning since we gave you the steroid injection in office today.  Benadryl has been sent to the pharmacy, an antihistamine,  you can use this every 6 hours as needed, reserve this medicine for bedtime due to a side effect of drowsiness.   You may continue taking the Claritin, 1 tablet by mouth daily until your symptoms resolved.  You may continue using the triamcinolone cream, you may use this 2 times a day on affected areas.  You may continue to use your albuterol inhaler every 4-6 hours as needed for shortness of breath.   If symptoms are not improving please present back to this office or follow-up.   If you develop any severe symptoms at home such as difficulty breathing, chest pain, worsening lightheadedness, palpitations, or any severe/concerning symptoms please go to the nearest emergency department.

## 2022-01-10 NOTE — ED Triage Notes (Signed)
Possible allergic reaction to nuts, pt has been having a cough, hives tickle in the throat x 2days

## 2022-01-22 ENCOUNTER — Other Ambulatory Visit: Payer: Self-pay | Admitting: Internal Medicine

## 2022-01-25 ENCOUNTER — Other Ambulatory Visit: Payer: Self-pay | Admitting: Internal Medicine

## 2022-01-26 ENCOUNTER — Encounter: Payer: Self-pay | Admitting: Internal Medicine

## 2022-01-26 MED ORDER — LIRAGLUTIDE 18 MG/3ML ~~LOC~~ SOPN: 0.6000 mg | PEN_INJECTOR | Freq: Every day | SUBCUTANEOUS | 5 refills | Status: DC

## 2022-02-05 NOTE — Telephone Encounter (Signed)
Patient Advocate Encounter   Received notification from [place] that prior authorization for Victoza is required.   PA submitted on 02/05/2022 [facility] [id number] Status is pending

## 2022-02-08 NOTE — Telephone Encounter (Signed)
Pharmacy Patient Advocate Encounter  Received notification from [facility] that the request for prior authorization for Victoza has been denied due to .    Please be advised we currently do not have a Pharmacist to review denials, therefore you will need to process appeals accordingly as needed. Thanks for your support at this time.   If appeal is needed, please see below: E-Appeal available: [facility])

## 2022-02-24 ENCOUNTER — Other Ambulatory Visit: Payer: Self-pay | Admitting: Internal Medicine

## 2022-05-03 ENCOUNTER — Other Ambulatory Visit: Payer: Self-pay | Admitting: Internal Medicine

## 2022-05-03 NOTE — Telephone Encounter (Signed)
LVMTCB and sched and sent mychart message

## 2022-05-03 NOTE — Telephone Encounter (Signed)
Pt needs a CPE with Dr Alphonsus Sias in the next 60 days (we gave her 2 months of refills). Please help her get scheduled. Thank you.

## 2022-05-13 ENCOUNTER — Encounter: Payer: 59 | Admitting: Internal Medicine

## 2022-06-25 ENCOUNTER — Encounter: Payer: Self-pay | Admitting: Internal Medicine

## 2022-06-25 ENCOUNTER — Ambulatory Visit (INDEPENDENT_AMBULATORY_CARE_PROVIDER_SITE_OTHER): Payer: 59 | Admitting: Internal Medicine

## 2022-06-25 VITALS — BP 118/88 | HR 60 | Temp 97.6°F | Ht 68.0 in | Wt 340.0 lb

## 2022-06-25 DIAGNOSIS — G8929 Other chronic pain: Secondary | ICD-10-CM

## 2022-06-25 DIAGNOSIS — Z0000 Encounter for general adult medical examination without abnormal findings: Secondary | ICD-10-CM

## 2022-06-25 DIAGNOSIS — M5442 Lumbago with sciatica, left side: Secondary | ICD-10-CM

## 2022-06-25 DIAGNOSIS — Z1231 Encounter for screening mammogram for malignant neoplasm of breast: Secondary | ICD-10-CM

## 2022-06-25 DIAGNOSIS — J4520 Mild intermittent asthma, uncomplicated: Secondary | ICD-10-CM

## 2022-06-25 DIAGNOSIS — F3341 Major depressive disorder, recurrent, in partial remission: Secondary | ICD-10-CM

## 2022-06-25 DIAGNOSIS — N6019 Diffuse cystic mastopathy of unspecified breast: Secondary | ICD-10-CM

## 2022-06-25 LAB — CBC
HCT: 43.4 % (ref 36.0–46.0)
Hemoglobin: 14 g/dL (ref 12.0–15.0)
MCHC: 32.2 g/dL (ref 30.0–36.0)
MCV: 91.4 fl (ref 78.0–100.0)
Platelets: 331 10*3/uL (ref 150.0–400.0)
RBC: 4.75 Mil/uL (ref 3.87–5.11)
RDW: 13.1 % (ref 11.5–15.5)
WBC: 6.4 10*3/uL (ref 4.0–10.5)

## 2022-06-25 LAB — COMPREHENSIVE METABOLIC PANEL
ALT: 13 U/L (ref 0–35)
AST: 16 U/L (ref 0–37)
Albumin: 3.9 g/dL (ref 3.5–5.2)
Alkaline Phosphatase: 72 U/L (ref 39–117)
BUN: 10 mg/dL (ref 6–23)
CO2: 28 mEq/L (ref 19–32)
Calcium: 9 mg/dL (ref 8.4–10.5)
Chloride: 103 mEq/L (ref 96–112)
Creatinine, Ser: 0.75 mg/dL (ref 0.40–1.20)
GFR: 100 mL/min (ref >60.00)
Glucose, Bld: 87 mg/dL (ref 70–99)
Potassium: 4 mEq/L (ref 3.5–5.1)
Sodium: 137 mEq/L (ref 135–145)
Total Bilirubin: 0.6 mg/dL (ref 0.2–1.2)
Total Protein: 7.7 g/dL (ref 6.0–8.3)

## 2022-06-25 LAB — TSH: TSH: 0.8 u[IU]/mL (ref 0.35–5.50)

## 2022-06-25 LAB — VITAMIN B12: Vitamin B-12: 556 pg/mL (ref 211–911)

## 2022-06-25 NOTE — Assessment & Plan Note (Signed)
Has kept off 100# since the surgery

## 2022-06-25 NOTE — Assessment & Plan Note (Signed)
Ongoing episodic symptoms but feels meds adequate Continues with counselor Escitalopram 10mg  and duloxetine 30mg  daily

## 2022-06-25 NOTE — Assessment & Plan Note (Signed)
Uses albuterol prn 

## 2022-06-25 NOTE — Progress Notes (Signed)
Subjective:    Patient ID: Robyn Jennings, female    DOB: 1980/02/12, 42 y.o.   MRN: [id number]  HPI Here for physical  Doing well Still full time nanny Satisfied with health--except noticed lump in right breast last week. Seems to have gone away  Is being careful with eating [facility] Weight [facility]--has kept off 100# since bariatric surgery  Current Outpatient Medications on File Prior to Visit  Medication Sig Dispense Refill   albuterol (VENTOLIN HFA) 108 (90 Base) MCG/ACT inhaler INHALE 1 PUFF INTO THE LUNGS 4 TIMES A DAY AS NEEDED 8.5 each 0   ALPRAZolam (XANAX) 0.25 MG tablet Take 1 tablet (0.25 mg total) by mouth 2 (two) times daily as needed for anxiety. 30 tablet 0   cyanocobalamin 1000 MCG tablet Take by mouth.     diphenhydrAMINE (BENADRYL) 25 MG tablet Take 1 tablet (25 mg total) by mouth every 6 (six) hours as needed. 30 tablet 0   DULoxetine (CYMBALTA) 30 MG capsule TAKE 1 CAPSULE BY MOUTH EVERY DAY 30 capsule 1   EPINEPHrine 0.3 mg/0.3 mL IJ SOAJ injection Inject 0.3 mg into the muscle as needed for anaphylaxis. 2 each 1   escitalopram (LEXAPRO) 10 MG tablet TAKE 1 TABLET BY MOUTH EVERY DAY 30 tablet 1   ferrous sulfate 325 (65 FE) MG tablet Take by mouth.     fluticasone (FLONASE) 50 MCG/ACT nasal spray Place 2 sprays into both nostrils daily. 16 g 6   ketoconazole (NIZORAL) 2 % cream Apply 1 Application topically daily. 45 g 1   LORATADINE ALLERGY RELIEF PO Take by mouth.     Multiple Vitamins-Minerals (WOMENS MULTIVITAMIN) TABS Take 1 tablet by mouth daily.      triamcinolone cream (KENALOG) 0.1 % Apply topically 2 (two) times daily.     No current facility-administered medications on file prior to visit.    Allergies  Allergen Reactions   Other Anaphylaxis    Nuts--All Types   Bupropion Other (See Comments)    Made pt feel lethargic    Cetirizine Palpitations   Venlafaxine Palpitations    Past Medical History:  Diagnosis Date    Asthma    seasonal well controlled   Family history of adverse reaction to anesthesia    grandmother took days to "wake up"   GERD (gastroesophageal reflux disease)    Major depression in partial remission (HCC)    Morbid obesity (HCC)    Nasal polyposis    Obstructive sleep apnea    no C-Pap    Past Surgical History:  Procedure Laterality Date   ANKLE SURGERY Left 2003   drilling for new collagen   EYE SURGERY Bilateral    PRK   FRACTURE SURGERY     LAPAROSCOPIC ROUX-EN-Y GASTRIC BYPASS WITH HIATAL HERNIA REPAIR N/A 12/19/2018   Procedure: LAPAROSCOPIC ROUX-EN-Y GASTRIC BYPASS WITH HIATAL HERNIA REPAIR, Upper Endo-ERAS Pathway;  Surgeon: [name], MD;  Location: [facility];  Service: General;  Laterality: N/A;   NASAL POLYP EXCISION      Family History  Problem Relation Age of Onset   Lupus Father    Heart disease Father    Congestive Heart Failure Father    Lupus Brother    Diabetes Maternal Grandmother    Heart disease Paternal Grandmother    Cancer Neg Hx     Social History   Socioeconomic History   Marital status: Single    Spouse name: Not on file   Number  of children: 0   Years of education: Not on file   Highest education level: Not on file  Occupational History   Occupation: Full time nanny  Tobacco Use   Smoking status: Never    Passive exposure: Current   Smokeless tobacco: Never  Vaping Use   Vaping Use: Never used  Substance and Sexual Activity   Alcohol use: Yes    Comment: occasional   Drug use: Never   Sexual activity: Not on file  Other Topics Concern   Not on file  Social History Narrative   Not on file   Social Determinants of Health   Financial Resource Strain: Not on file  Food Insecurity: Not on file  Transportation Needs: Not on file  Physical Activity: Not on file  Stress: Not on file  Social Connections: Not on file  Intimate Partner Violence: Not on file   Review of Systems  Constitutional:  Negative for fatigue and  unexpected weight change.       Wears seat belt  HENT:  Negative for dental problem, hearing loss and tinnitus.        Keeps up with dentist  Eyes:        Some trouble with left eye in the morning--has sense of mucus present (but nothing visible)---once a week   Respiratory:  Negative for cough, chest tightness and shortness of breath.   Cardiovascular:  Positive for palpitations. Negative for chest pain.       Does note periodic elevated heart rate (130 on Apple watch)---fairly rare Dependent edema if on feet all day  Gastrointestinal:  Negative for blood in stool and constipation.       No heartburn  Endocrine: Negative for polydipsia and polyuria.  Genitourinary:  Negative for dyspareunia, dysuria and hematuria.       Periods normal Abstinent now  Musculoskeletal:  Positive for back pain. Negative for arthralgias and joint swelling.       Uses tylenol prn Swimming has helped  Skin:  Negative for rash.  Allergic/Immunologic: Positive for environmental allergies. Negative for immunocompromised state.       Uses loratadine and nasal spray prn  Neurological:  Negative for dizziness, syncope, light-headedness and headaches.  Hematological:  Negative for adenopathy. Does not bruise/bleed easily.  Psychiatric/Behavioral:  Negative for sleep disturbance.        Does get "lulls" of several days with depressed mood. Able to still work--then comes home to bed. Gets some anhedonia Occasional alcohol "to numb" Coping mechanisms are outreach to friends, getting out Some suicidal thoughts--but no active ideation Continues with therapist       Objective:   Physical Exam Constitutional:      Appearance: Normal appearance.  HENT:     Mouth/Throat:     Pharynx: No oropharyngeal exudate or posterior oropharyngeal erythema.  Eyes:     Conjunctiva/sclera: Conjunctivae normal.     Pupils: Pupils are equal, round, and reactive to light.  Cardiovascular:     Rate and Rhythm: Normal rate and  regular rhythm.     Heart sounds: No murmur heard.    No gallop.     Comments: Faint to absent pedal pulses--feet warm Pulmonary:     Effort: Pulmonary effort is normal.     Breath sounds: Normal breath sounds. No wheezing or rales.  Abdominal:     Palpations: Abdomen is soft.     Tenderness: There is no abdominal tenderness.  Genitourinary:    Comments: Moderate bilateral cystic changes in breasts  without worrisome mass Musculoskeletal:     Cervical back: Neck supple.     Right lower leg: No edema.     Left lower leg: No edema.  Lymphadenopathy:     Cervical: No cervical adenopathy.  Skin:    Findings: No rash.  Neurological:     General: No focal deficit present.     Mental Status: She is alert and oriented to person, place, and time.  Psychiatric:        Mood and Affect: Mood normal.        Behavior: Behavior normal.            Assessment & Plan:

## 2022-06-25 NOTE — Assessment & Plan Note (Signed)
Better since she has been swimming

## 2022-06-25 NOTE — Assessment & Plan Note (Signed)
Healthy Pap due 2026 Will set up screening mammogram soon --turning 42 Exercising more Update COVID/flu in the fall

## 2022-06-29 ENCOUNTER — Other Ambulatory Visit: Payer: Self-pay | Admitting: Internal Medicine

## 2022-07-05 ENCOUNTER — Other Ambulatory Visit: Payer: 59

## 2022-07-11 ENCOUNTER — Other Ambulatory Visit: Payer: Self-pay | Admitting: Internal Medicine

## 2022-07-16 ENCOUNTER — Ambulatory Visit
Admission: RE | Admit: 2022-07-16 | Discharge: 2022-07-16 | Disposition: A | Discharge status: Home / Self Care | Payer: 59 | Source: Ambulatory Visit | Attending: Internal Medicine | Admitting: Internal Medicine

## 2022-07-16 DIAGNOSIS — N6019 Diffuse cystic mastopathy of unspecified breast: Secondary | ICD-10-CM | POA: Diagnosis present

## 2022-07-27 ENCOUNTER — Encounter (HOSPITAL_COMMUNITY): Payer: Self-pay | Admitting: *Deleted

## 2022-09-06 ENCOUNTER — Encounter: Payer: Self-pay | Admitting: Internal Medicine

## 2022-09-07 ENCOUNTER — Other Ambulatory Visit: Payer: Self-pay | Admitting: Internal Medicine

## 2022-09-08 NOTE — Telephone Encounter (Signed)
Last filled 10-29-21 #30 Last OV 06-25-22 Next OV 06-28-23 [facility]

## 2022-12-25 ENCOUNTER — Other Ambulatory Visit: Payer: Self-pay | Admitting: Internal Medicine

## 2022-12-27 NOTE — Telephone Encounter (Signed)
Last filled 09-08-22 #30 Last OV 06-25-22 Next OV 06-28-23 [facility]

## 2023-02-09 ENCOUNTER — Ambulatory Visit: Payer: 59 | Admitting: Internal Medicine

## 2023-02-09 VITALS — BP 120/82 | HR 81 | Temp 98.5°F | Ht 68.0 in | Wt 346.0 lb

## 2023-02-09 DIAGNOSIS — E7849 Other hyperlipidemia: Secondary | ICD-10-CM | POA: Diagnosis not present

## 2023-02-09 DIAGNOSIS — M2550 Pain in unspecified joint: Secondary | ICD-10-CM | POA: Insufficient documentation

## 2023-02-09 DIAGNOSIS — E785 Hyperlipidemia, unspecified: Secondary | ICD-10-CM | POA: Insufficient documentation

## 2023-02-09 LAB — COMPREHENSIVE METABOLIC PANEL
ALT: 11 U/L (ref 0–35)
AST: 14 U/L (ref 0–37)
Albumin: 3.8 g/dL (ref 3.5–5.2)
Alkaline Phosphatase: 70 U/L (ref 39–117)
BUN: 11 mg/dL (ref 6–23)
CO2: 29 meq/L (ref 19–32)
Calcium: 9 mg/dL (ref 8.4–10.5)
Chloride: 103 meq/L (ref 96–112)
Creatinine, Ser: 0.79 mg/dL (ref 0.40–1.20)
GFR: 93.54 mL/min (ref >60.00)
Glucose, Bld: 58 mg/dL — ABNORMAL LOW (ref 70–99)
Potassium: 4 meq/L (ref 3.5–5.1)
Sodium: 138 meq/L (ref 135–145)
Total Bilirubin: 0.5 mg/dL (ref 0.2–1.2)
Total Protein: 6.8 g/dL (ref 6.0–8.3)

## 2023-02-09 LAB — TSH: TSH: 1.34 u[IU]/mL (ref 0.35–5.50)

## 2023-02-09 LAB — CBC
HCT: 41.9 % (ref 36.0–46.0)
Hemoglobin: 13.4 g/dL (ref 12.0–15.0)
MCHC: 31.9 g/dL (ref 30.0–36.0)
MCV: 92.9 fL (ref 78.0–100.0)
Platelets: 286 10*3/uL (ref 150.0–400.0)
RBC: 4.51 Mil/uL (ref 3.87–5.11)
RDW: 14.3 % (ref 11.5–15.5)
WBC: 6.9 10*3/uL (ref 4.0–10.5)

## 2023-02-09 LAB — SEDIMENTATION RATE: Sed Rate: 35 mm/h — ABNORMAL HIGH (ref 0–20)

## 2023-02-09 NOTE — Assessment & Plan Note (Signed)
Mostly knees and ankles No clear inflammatory findings---could be stress from weight Will just check some labs Is on tumeric occasionally Discussed tylenol----topical diclofenac

## 2023-02-09 NOTE — Progress Notes (Signed)
Subjective:    Patient ID: Robyn Jennings, female    DOB: 12-01-1980, 43 y.o.   MRN: [id number]  HPI Here due to joint swelling and pain  Feels "inflammation in my joints" Knees and ankles are worst Lots of pain--but not new  Concerned about lipedema--pockets of fluid Pain and limited mobility Mostly upper thighs and hips Tight leggings help some  Thinks she had allergic reaction to something---took left over steroid This took away the joint pains and aching  Tried stretching, vibration points, etc---brief help  Current Outpatient Medications on File Prior to Visit  Medication Sig Dispense Refill   albuterol (VENTOLIN HFA) 108 (90 Base) MCG/ACT inhaler INHALE 1 PUFF INTO THE LUNGS 4 TIMES A DAY AS NEEDED 8.5 each 0   ALPRAZolam (XANAX) 0.25 MG tablet TAKE 1 TABLET BY MOUTH TWICE A DAY AS NEEDED FOR ANXIETY 30 tablet 0   diphenhydrAMINE (BENADRYL) 25 MG tablet Take 1 tablet (25 mg total) by mouth every 6 (six) hours as needed. 30 tablet 0   EPINEPHrine 0.3 mg/0.3 mL IJ SOAJ injection Inject 0.3 mg into the muscle as needed for anaphylaxis. 2 each 1   escitalopram (LEXAPRO) 10 MG tablet TAKE 1 TABLET BY MOUTH EVERY DAY 90 tablet 3   fluticasone (FLONASE) 50 MCG/ACT nasal spray Place 2 sprays into both nostrils daily. 16 g 6   ketoconazole (NIZORAL) 2 % cream Apply 1 Application topically daily. 45 g 1   LORATADINE ALLERGY RELIEF PO Take by mouth.     triamcinolone cream (KENALOG) 0.1 % Apply topically 2 (two) times daily.     No current facility-administered medications on file prior to visit.    Allergies  Allergen Reactions   Other Anaphylaxis    Nuts--All Types   Bupropion Other (See Comments)    Made pt feel lethargic    Cetirizine Palpitations   Venlafaxine Palpitations    Past Medical History:  Diagnosis Date   Asthma    seasonal well controlled   Family history of adverse reaction to anesthesia    grandmother took days to "wake up"   GERD (gastroesophageal  reflux disease)    Major depression in partial remission (HCC)    Morbid obesity (HCC)    Nasal polyposis    Obstructive sleep apnea    no C-Pap    Past Surgical History:  Procedure Laterality Date   ANKLE SURGERY Left 2003   drilling for new collagen   EYE SURGERY Bilateral    PRK   FRACTURE SURGERY     LAPAROSCOPIC ROUX-EN-Y GASTRIC BYPASS WITH HIATAL HERNIA REPAIR N/A 12/19/2018   Procedure: LAPAROSCOPIC ROUX-EN-Y GASTRIC BYPASS WITH HIATAL HERNIA REPAIR, Upper Endo-ERAS Pathway;  Surgeon: [name], MD;  Location: [facility];  Service: General;  Laterality: N/A;   NASAL POLYP EXCISION      Family History  Problem Relation Age of Onset   Lupus Father    Heart disease Father    Congestive Heart Failure Father    Lupus Brother    Diabetes Maternal Grandmother    Heart disease Paternal Grandmother    Cancer Neg Hx     Social History   Socioeconomic History   Marital status: Single    Spouse name: Not on file   Number of children: 0   Years of education: Not on file   Highest education level: Master's degree (e.g., MA, MS, MEng, MEd, MSW, MBA)  Occupational History   Occupation: Full time nanny  Tobacco Use   Smoking  status: Never    Passive exposure: Current   Smokeless tobacco: Never  Vaping Use   Vaping status: Never Used  Substance and Sexual Activity   Alcohol use: Yes    Comment: occasional   Drug use: Never   Sexual activity: Not on file  Other Topics Concern   Not on file  Social History Narrative   Not on file   Social Drivers of Health   Financial Resource Strain: Low Risk  (02/09/2023)   Overall Financial Resource Strain (CARDIA)    Difficulty of Paying Living Expenses: Not very hard  Food Insecurity: No Food Insecurity (02/09/2023)   Hunger Vital Sign    Worried About Running Out of Food in the Last Year: Never true    Ran Out of Food in the Last Year: Never true  Transportation Needs: No Transportation Needs (02/09/2023)   PRAPARE -  Administrator, Civil Service (Medical): No    Lack of Transportation (Non-Medical): No  Physical Activity: Insufficiently Active (02/09/2023)   Exercise Vital Sign    Days of Exercise per Week: 4 days    Minutes of Exercise per Session: 30 min  Stress: No Stress Concern Present (02/09/2023)   [employer] of Occupational Health - Occupational Stress Questionnaire    Feeling of Stress : Only a little  Social Connections: Unknown (02/09/2023)   Social Connection and Isolation Panel [NHANES]    Frequency of Communication with Friends and Family: Once a week    Frequency of Social Gatherings with Friends and Family: Patient declined    Attends Religious Services: 1 to 4 times per year    Active Member of [employer] or Organizations: No    Attends Engineer, structural: Not on file    Marital Status: Never married  Intimate Partner Violence: Not on file   Review of Systems No psoriasis Some sun sensitivity No dysphagia Had mild illness a month ago---had travelled before that. Better now    Objective:   Physical Exam Constitutional:      Appearance: Normal appearance.  Musculoskeletal:     Comments: No true synovitis in upper or lower extremity  Calves are not edematous--just thick Apparent abnormal fat deposition in upper medial thighs and over hips  Neurological:     Mental Status: She is alert.            Assessment & Plan:

## 2023-02-09 NOTE — Assessment & Plan Note (Signed)
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

## 2023-02-10 ENCOUNTER — Other Ambulatory Visit: Payer: Self-pay | Admitting: Internal Medicine

## 2023-02-10 ENCOUNTER — Encounter: Payer: Self-pay | Admitting: Internal Medicine

## 2023-02-10 DIAGNOSIS — R768 Other specified abnormal immunological findings in serum: Secondary | ICD-10-CM

## 2023-02-10 LAB — RHEUMATOID FACTOR: Rheumatoid fact SerPl-aCnc: 54 [IU]/mL — ABNORMAL HIGH (ref <14)

## 2023-02-10 LAB — ANA W/REFLEX: Anti Nuclear Antibody (ANA): NEGATIVE

## 2023-02-12 ENCOUNTER — Other Ambulatory Visit: Payer: Self-pay | Admitting: Internal Medicine

## 2023-02-16 ENCOUNTER — Other Ambulatory Visit (INDEPENDENT_AMBULATORY_CARE_PROVIDER_SITE_OTHER): Payer: 59

## 2023-02-16 DIAGNOSIS — R768 Other specified abnormal immunological findings in serum: Secondary | ICD-10-CM | POA: Diagnosis not present

## 2023-02-19 ENCOUNTER — Encounter: Payer: Self-pay | Admitting: Internal Medicine

## 2023-02-19 LAB — CYCLIC CITRUL PEPTIDE ANTIBODY, IGG/IGA: Cyclic Citrullin Peptide Ab: 5 U (ref 0–19)

## 2023-02-23 ENCOUNTER — Other Ambulatory Visit: Payer: Self-pay

## 2023-02-23 ENCOUNTER — Emergency Department (HOSPITAL_COMMUNITY)
Admission: EM | Admit: 2023-02-23 | Discharge: 2023-02-23 | Disposition: A | Discharge status: Home / Self Care | Payer: 59 | Attending: Emergency Medicine | Admitting: Emergency Medicine

## 2023-02-23 ENCOUNTER — Encounter (HOSPITAL_COMMUNITY): Payer: Self-pay | Admitting: Emergency Medicine

## 2023-02-23 ENCOUNTER — Emergency Department (HOSPITAL_COMMUNITY): Payer: 59

## 2023-02-23 DIAGNOSIS — R1110 Vomiting, unspecified: Secondary | ICD-10-CM | POA: Diagnosis not present

## 2023-02-23 DIAGNOSIS — J45909 Unspecified asthma, uncomplicated: Secondary | ICD-10-CM | POA: Insufficient documentation

## 2023-02-23 DIAGNOSIS — R1011 Right upper quadrant pain: Secondary | ICD-10-CM | POA: Diagnosis present

## 2023-02-23 DIAGNOSIS — R109 Unspecified abdominal pain: Secondary | ICD-10-CM

## 2023-02-23 LAB — URINALYSIS, ROUTINE W REFLEX MICROSCOPIC
Bilirubin Urine: NEGATIVE
Glucose, UA: NEGATIVE mg/dL
Hgb urine dipstick: NEGATIVE
Ketones, ur: NEGATIVE mg/dL
Leukocytes,Ua: NEGATIVE
Nitrite: NEGATIVE
Protein, ur: 30 mg/dL — AB
Specific Gravity, Urine: 1.031 — ABNORMAL HIGH (ref 1.005–1.030)
pH: 5 (ref 5.0–8.0)

## 2023-02-23 LAB — COMPREHENSIVE METABOLIC PANEL
ALT: 18 U/L (ref 0–44)
AST: 20 U/L (ref 15–41)
Albumin: 3.1 g/dL — ABNORMAL LOW (ref 3.5–5.0)
Alkaline Phosphatase: 60 U/L (ref 38–126)
Anion gap: 8 (ref 5–15)
BUN: 11 mg/dL (ref 6–20)
CO2: 24 mmol/L (ref 22–32)
Calcium: 8.6 mg/dL — ABNORMAL LOW (ref 8.9–10.3)
Chloride: 103 mmol/L (ref 98–111)
Creatinine, Ser: 0.91 mg/dL (ref 0.44–1.00)
GFR, Estimated: >60 mL/min (ref >60)
Glucose, Bld: 112 mg/dL — ABNORMAL HIGH (ref 70–99)
Potassium: 4.2 mmol/L (ref 3.5–5.1)
Sodium: 135 mmol/L (ref 135–145)
Total Bilirubin: 0.6 mg/dL (ref 0.0–1.2)
Total Protein: 6.7 g/dL (ref 6.5–8.1)

## 2023-02-23 LAB — CBC
HCT: 41.7 % (ref 36.0–46.0)
Hemoglobin: 13.3 g/dL (ref 12.0–15.0)
MCH: 29.9 pg (ref 26.0–34.0)
MCHC: 31.9 g/dL (ref 30.0–36.0)
MCV: 93.7 fL (ref 80.0–100.0)
Platelets: 297 10*3/uL (ref 150–400)
RBC: 4.45 MIL/uL (ref 3.87–5.11)
RDW: 13 % (ref 11.5–15.5)
WBC: 6.2 10*3/uL (ref 4.0–10.5)
nRBC: 0 % (ref 0.0–0.2)

## 2023-02-23 LAB — LIPASE, BLOOD: Lipase: 32 U/L (ref 11–51)

## 2023-02-23 LAB — HCG, SERUM, QUALITATIVE: Preg, Serum: NEGATIVE

## 2023-02-23 MED ORDER — ALUM & MAG HYDROXIDE-SIMETH 200-200-20 MG/5ML PO SUSP
30.0000 mL | Freq: Once | ORAL | Status: AC
  Administered 2023-02-23: 30 mL via ORAL
  Filled 2023-02-23: qty 30

## 2023-02-23 MED ORDER — IOHEXOL 350 MG/ML SOLN
75.0000 mL | Freq: Once | INTRAVENOUS | Status: AC | PRN
  Administered 2023-02-23: 75 mL via INTRAVENOUS

## 2023-02-23 MED ORDER — MORPHINE SULFATE (PF) 2 MG/ML IV SOLN: 2.0000 mg | Freq: Once | INTRAVENOUS | Status: DC

## 2023-02-23 MED ORDER — SODIUM CHLORIDE 0.9 % IV BOLUS
1000.0000 mL | Freq: Once | INTRAVENOUS | Status: AC
  Administered 2023-02-23: 1000 mL via INTRAVENOUS

## 2023-02-23 MED ORDER — PANTOPRAZOLE SODIUM 20 MG PO TBEC: 20.0000 mg | DELAYED_RELEASE_TABLET | Freq: Every day | ORAL | 0 refills | Status: DC

## 2023-02-23 MED ORDER — KETOROLAC TROMETHAMINE 15 MG/ML IJ SOLN
15.0000 mg | Freq: Once | INTRAMUSCULAR | Status: AC
  Administered 2023-02-23: 15 mg via INTRAVENOUS
  Filled 2023-02-23: qty 1

## 2023-02-23 MED ORDER — MORPHINE SULFATE (PF) 4 MG/ML IV SOLN
4.0000 mg | Freq: Once | INTRAVENOUS | Status: AC
  Administered 2023-02-23: 4 mg via INTRAVENOUS
  Filled 2023-02-23: qty 1

## 2023-02-23 MED ORDER — ONDANSETRON HCL 4 MG PO TABS: 4.0000 mg | ORAL_TABLET | Freq: Four times a day (QID) | ORAL | 0 refills | Status: AC

## 2023-02-23 MED ORDER — ONDANSETRON 4 MG PO TBDP
4.0000 mg | ORAL_TABLET | Freq: Once | ORAL | Status: AC | PRN
  Administered 2023-02-23: 4 mg via ORAL
  Filled 2023-02-23: qty 1

## 2023-02-23 MED ORDER — FAMOTIDINE 20 MG PO TABS
20.0000 mg | ORAL_TABLET | Freq: Once | ORAL | Status: AC
  Administered 2023-02-23: 20 mg via ORAL
  Filled 2023-02-23: qty 1

## 2023-02-23 NOTE — ED Notes (Signed)
CCMD called.

## 2023-02-23 NOTE — ED Notes (Signed)
Patient given ginger ale with meds as PO challenge.

## 2023-02-23 NOTE — ED Provider Notes (Addendum)
[place] EMERGENCY DEPARTMENT AT [facility] Provider Note   CSN: [id number] Arrival date & time: 02/23/23  0444     History  Chief Complaint  Patient presents with   Abdominal Pain   Emesis   HPI Robyn Jennings is a 43 y.o. female with history of gastric bypass surgery in 2020, GERD, asthma presenting for abdominal pain.  Started acutely around 1 AM this morning and woke her up from sleep.  Located in the right upper quadrant and at times radiates towards the center of her chest.  Feels like a dull "gnawing pain".  Also states she woke up with a sore throat as well.  She took some Tylenol and tried to lay back down but stated she vomited 3 times.  States she is concerned she may be refluxing which is not a symptom she has had since her surgery.  Denies urinary symptoms.  Abdominal Pain Associated symptoms: vomiting   Emesis Associated symptoms: abdominal pain        Home Medications Prior to Admission medications   Medication Sig Start Date End Date Taking? Authorizing Provider  ondansetron (ZOFRAN) 4 MG tablet Take 1 tablet (4 mg total) by mouth every 6 (six) hours. 02/23/23  Yes Gareth Eagle, PA-C  pantoprazole (PROTONIX) 20 MG tablet Take 1 tablet (20 mg total) by mouth daily. 02/23/23  Yes Gareth Eagle, PA-C  albuterol (VENTOLIN HFA) 108 (90 Base) MCG/ACT inhaler INHALE 1 PUFF INTO THE LUNGS 4 TIMES A DAY AS NEEDED 02/14/23   [name], MD  ALPRAZolam Prudy Feeler) 0.25 MG tablet TAKE 1 TABLET BY MOUTH TWICE A DAY AS NEEDED FOR ANXIETY 12/27/22   [name], MD  diphenhydrAMINE (BENADRYL) 25 MG tablet Take 1 tablet (25 mg total) by mouth every 6 (six) hours as needed. 01/10/22   Debby Freiberg, NP  EPINEPHrine 0.3 mg/0.3 mL IJ SOAJ injection Inject 0.3 mg into the muscle as needed for anaphylaxis. 03/02/21   [name], MD  escitalopram (LEXAPRO) 10 MG tablet TAKE 1 TABLET BY MOUTH EVERY DAY 06/29/22   [name], MD  fluticasone  (FLONASE) 50 MCG/ACT nasal spray Place 2 sprays into both nostrils daily. 04/02/20   Junie Spencer, FNP  ketoconazole (NIZORAL) 2 % cream Apply 1 Application topically daily. 07/27/21   [name], MD  LORATADINE ALLERGY RELIEF PO Take by mouth.    [provider]  triamcinolone cream (KENALOG) 0.1 % Apply topically 2 (two) times daily. 03/27/21   [provider]      Allergies    Other, Bupropion, Cetirizine, and Venlafaxine    Review of Systems   Review of Systems  Gastrointestinal:  Positive for abdominal pain and vomiting.    Physical Exam Updated Vital Signs BP 135/80 (BP Location: Left Arm)   Pulse 60   Temp 97.9 F (36.6 C) (Oral)   Resp 20   Ht 5\' 8"  (1.727 m)   Wt (!) 154.2 kg   LMP 01/31/2023 (Approximate)   SpO2 100%   BMI 51.70 kg/m  Physical Exam Vitals and nursing note reviewed.  HENT:     Head: Normocephalic and atraumatic.     Mouth/Throat:     Mouth: Mucous membranes are moist.  Eyes:     General:        Right eye: No discharge.        Left eye: No discharge.     Conjunctiva/sclera: Conjunctivae normal.  Cardiovascular:  Rate and Rhythm: Normal rate and regular rhythm.     Pulses: Normal pulses.     Heart sounds: Normal heart sounds.  Pulmonary:     Effort: Pulmonary effort is normal.     Breath sounds: Normal breath sounds.  Abdominal:     General: Abdomen is flat.     Palpations: Abdomen is soft.     Tenderness: There is abdominal tenderness in the right upper quadrant.  Skin:    General: Skin is warm and dry.  Neurological:     General: No focal deficit present.  Psychiatric:        Mood and Affect: Mood normal.     ED Results / Procedures / Treatments   Labs (all labs ordered are listed, but only abnormal results are displayed) Labs Reviewed  COMPREHENSIVE METABOLIC PANEL - Abnormal; Notable for the following components:      Result Value   Glucose, Bld 112 (*)    Calcium 8.6 (*)    Albumin 3.1 (*)     All other components within normal limits  URINALYSIS, ROUTINE W REFLEX MICROSCOPIC - Abnormal; Notable for the following components:   APPearance HAZY (*)    Specific Gravity, Urine 1.031 (*)    Protein, ur 30 (*)    Bacteria, UA FEW (*)    All other components within normal limits  LIPASE, BLOOD  CBC  HCG, SERUM, QUALITATIVE    EKG None  Radiology DG Chest 1 View Result Date: 02/23/2023 CLINICAL DATA:  Chest pain.  Nausea/vomiting. EXAM: CHEST  1 VIEW COMPARISON:  06/19/2018. FINDINGS: Low lung volume. Bilateral lung fields are clear. Bilateral costophrenic angles are clear. Note is made of elevated right hemidiaphragm. Mildly enlarged cardio-mediastinal silhouette, likely accentuated by low lung volume and AP technique. No acute osseous abnormalities. The soft tissues are within normal limits. No free air under the domes of diaphragm. IMPRESSION: No active disease. Electronically Signed   By: Jules Schick M.D.   On: 02/23/2023 13:05   CT ABDOMEN PELVIS W CONTRAST Result Date: 02/23/2023 CLINICAL DATA:  Abdominal pain, acute, nonlocalized. EXAM: CT ABDOMEN AND PELVIS WITH CONTRAST TECHNIQUE: Multidetector CT imaging of the abdomen and pelvis was performed using the standard protocol following bolus administration of intravenous contrast. RADIATION DOSE REDUCTION: This exam was performed according to the departmental dose-optimization program which includes automated exposure control, adjustment of the mA and/or kV according to patient size and/or use of iterative reconstruction technique. CONTRAST:  75mL OMNIPAQUE IOHEXOL 350 MG/ML SOLN COMPARISON:  None Available. FINDINGS: Lower chest: There are subpleural atelectatic changes in the visualized lung bases. No overt consolidation. No pleural effusion. The heart is normal in size. No pericardial effusion. Hepatobiliary: The liver is normal in size. Non-cirrhotic configuration. No suspicious mass. There is a sub 5 mm hypoattenuating focus in  the right hepatic dome, which is too small to adequately characterize. No intrahepatic or extrahepatic bile duct dilation. No calcified gallstones. Normal gallbladder wall thickness. No pericholecystic inflammatory changes. Pancreas: Unremarkable. No pancreatic ductal dilatation or surrounding inflammatory changes. Spleen: Within normal limits. No focal lesion. Adrenals/Urinary Tract: Adrenal glands are unremarkable. No suspicious renal mass. No hydronephrosis. No renal or ureteric calculi. Unremarkable urinary bladder. Stomach/Bowel: Postsurgical changes from prior gastric bypass noted. No disproportionate dilation of the small or large bowel loops. No evidence of abnormal bowel wall thickening or inflammatory changes. The appendix is unremarkable. Vascular/Lymphatic: No ascites or pneumoperitoneum. No abdominal or pelvic lymphadenopathy, by size criteria. No aneurysmal dilation of the major  abdominal arteries. Reproductive: There is lobulated anteverted uterus likely secondary to underlying leiomyomas, not well evaluated on the CT scan exam. Bilateral ovaries are within normal limits. No large adnexal mass seen. Other: There is a tiny fat containing umbilical hernia. The soft tissues and abdominal wall are otherwise unremarkable. Musculoskeletal: No suspicious osseous lesions. There are mild multilevel degenerative changes in the visualized spine. IMPRESSION: 1. No acute inflammatory process identified within the abdomen or pelvis. 2. Multiple other nonacute observations (such as prior gastric bypass, uterine leiomyomas, etc.), as described above. Electronically Signed   By: Jules Schick M.D.   On: 02/23/2023 13:05   US Abdomen Limited RUQ (LIVER/GB) Result Date: 02/23/2023 CLINICAL DATA:  Right upper quadrant pain EXAM: ULTRASOUND ABDOMEN LIMITED RIGHT UPPER QUADRANT COMPARISON:  None Available. FINDINGS: Gallbladder: No gallstones or wall thickening visualized. No sonographic Murphy sign noted by  sonographer. Common bile duct: Diameter: 3.3 mm Liver: No focal lesion identified. Within normal limits in parenchymal echogenicity. Portal vein is patent on color Doppler imaging with normal direction of blood flow towards the liver. Other: No free fluid or ascites. IMPRESSION: Normal right upper quadrant ultrasound. Electronically Signed   By: Judie Petit.  Shick M.D.   On: 02/23/2023 10:14    Procedures Procedures    Medications Ordered in ED Medications  ondansetron (ZOFRAN-ODT) disintegrating tablet 4 mg (4 mg Oral Given 02/23/23 0511)  morphine (PF) 4 MG/ML injection 4 mg (4 mg Intravenous Given 02/23/23 0850)  sodium chloride 0.9 % bolus 1,000 mL (0 mLs Intravenous Stopped 02/23/23 1241)  iohexol (OMNIPAQUE) 350 MG/ML injection 75 mL (75 mLs Intravenous Contrast Given 02/23/23 1119)  ketorolac (TORADOL) 15 MG/ML injection 15 mg (15 mg Intravenous Given 02/23/23 1337)  alum & mag hydroxide-simeth (MAALOX/MYLANTA) 200-200-20 MG/5ML suspension 30 mL (30 mLs Oral Given 02/23/23 1335)  famotidine (PEPCID) tablet 20 mg (20 mg Oral Given 02/23/23 1337)    ED Course/ Medical Decision Making/ A&P Clinical Course as of 02/23/23 1400  Wed Feb 23, 2023  1234 nRBC: 0.0 [JR]    Clinical Course User Index [JR] Gareth Eagle, PA-C                                 Medical Decision Making Amount and/or Complexity of Data Reviewed Labs: ordered. Decision-making details documented in ED Course. Radiology: ordered.  Risk OTC drugs. Prescription drug management.   Initial Impression and Ddx 43 year old well-appearing female presenting for abdominal pain. Exam notable for RUQ tenderness. Ddx includes acute cholecystitis, kidney stone, perforated viscus, ACS, reflux, other. Patient PMH that increases complexity of ED encounter:  history of gastric bypass surgery in 2020, GERD, asthma   Interpretation of Diagnostics - I independent reviewed and interpreted the labs as followed: Mild hyperglycemia  - I  independently visualized the following imaging with scope of interpretation limited to determining acute life threatening conditions related to emergency care: No acute findings on CT of the abdomen pelvis and right upper quadrant ultrasound  - I personally reviewed and interpreted EKG which revealed sinus bradycardia  Patient Reassessment and Ultimate Disposition/Management On reassessment, abdominal pain had improved significantly.  Still had some discomfort however, treated with Toradol and GI cocktail.  Fluid challenge with no issue.  Imaging studies were reassuring.  Did share nonacute on CT with patient.  Advised her to follow-up with her PCP.  Started her on Protonix.  Symptoms could be related to reflux.  Advised her to  follow-up with her gastric sleeve surgeon also. Also considered ACS but unlikely given reassuring EKG and atypical chest pain.  Discussed pertinent return precautions.  Discharged good condition.  Patient management required discussion with the following services or consulting groups:  None  Complexity of Problems Addressed Acute complicated illness or Injury  Additional Data Reviewed and Analyzed Further history obtained from: Past medical history and medications listed in the EMR and Prior ED visit notes  Patient Encounter Risk Assessment Prescriptions     Final Clinical Impression(s) / ED Diagnoses Final diagnoses:  Abdominal pain, unspecified abdominal location    Rx / DC Orders ED Discharge Orders          Ordered    pantoprazole (PROTONIX) 20 MG tablet  Daily        02/23/23 1330    ondansetron (ZOFRAN) 4 MG tablet  Every 6 hours        02/23/23 1359             Gareth Eagle, PA-C 02/23/23 1353    Gareth Eagle, PA-C 02/23/23 1400    Melene Plan, DO 02/23/23 1405

## 2023-02-23 NOTE — ED Triage Notes (Signed)
  Patient comes in with RUQ pain and emesis that started around 0100 this morning.  Patient states she woke up with a sore throat and RUQ pain, took some tylenol and tried to lay down but ended up having 3 episodes of emesis.  Hx gastric bypass surgery.  States emesis was clear.  Pain 8/10, dull.

## 2023-02-23 NOTE — Discharge Instructions (Addendum)
Evaluation today was overall reassuring.  Suspect her symptoms could be related to reflux.  I am sending a 15-day course of Protonix to your pharmacy.  Please follow-up your PCP in be mindful of your symptoms at home.  If you develop worsening abdominal pain, fever, inability to tolerate fluid intake, or any other concern please return to the emergency department for further evaluation.

## 2023-03-06 ENCOUNTER — Other Ambulatory Visit: Payer: Self-pay | Admitting: Internal Medicine

## 2023-03-10 ENCOUNTER — Institutional Professional Consult (permissible substitution): Payer: 59 | Admitting: Plastic Surgery

## 2023-03-22 ENCOUNTER — Encounter: Payer: Self-pay | Admitting: Internal Medicine

## 2023-03-22 MED ORDER — PANTOPRAZOLE SODIUM 20 MG PO TBEC: 20.0000 mg | DELAYED_RELEASE_TABLET | Freq: Every day | ORAL | 3 refills | Status: AC

## 2023-05-11 ENCOUNTER — Other Ambulatory Visit: Payer: Self-pay | Admitting: Internal Medicine

## 2023-05-12 NOTE — Telephone Encounter (Signed)
 Last filled 12-27-22 Last OV 02-09-23 Next OV 06-28-23 [facility]

## 2023-06-28 ENCOUNTER — Encounter: Payer: Self-pay | Admitting: Internal Medicine

## 2023-06-28 ENCOUNTER — Ambulatory Visit (INDEPENDENT_AMBULATORY_CARE_PROVIDER_SITE_OTHER): Payer: 59 | Admitting: Internal Medicine

## 2023-06-28 VITALS — BP 120/80 | HR 75 | Temp 98.6°F | Ht 67.75 in | Wt 346.0 lb

## 2023-06-28 DIAGNOSIS — Z0000 Encounter for general adult medical examination without abnormal findings: Secondary | ICD-10-CM

## 2023-06-28 DIAGNOSIS — F3341 Major depressive disorder, recurrent, in partial remission: Secondary | ICD-10-CM | POA: Diagnosis not present

## 2023-06-28 DIAGNOSIS — J4520 Mild intermittent asthma, uncomplicated: Secondary | ICD-10-CM

## 2023-06-28 DIAGNOSIS — Z124 Encounter for screening for malignant neoplasm of cervix: Secondary | ICD-10-CM

## 2023-06-28 DIAGNOSIS — G4733 Obstructive sleep apnea (adult) (pediatric): Secondary | ICD-10-CM

## 2023-06-28 NOTE — Progress Notes (Signed)
 Subjective:    Patient ID: Robyn Jennings, female    DOB: Dec 10, 1980, 43 y.o.   MRN: [id number]  HPI Here for physical  Still has various aches and pains Eating anti-inflammatory diet---less sugar and fried foods Low impact yoga, etc Tylenol  and tumeric regularly---some help  Stomach issues are better with the daily pantoprazole  Occasional mild flares with dietary changes (like an orange)  Current Outpatient Medications on File Prior to Visit  Medication Sig Dispense Refill   albuterol  (VENTOLIN  HFA) 108 (90 Base) MCG/ACT inhaler INHALE 1 PUFF INTO THE LUNGS 4 TIMES A DAY AS NEEDED 8.5 each 0   ALPRAZolam  (XANAX ) 0.25 MG tablet TAKE 1 TABLET BY MOUTH TWICE A DAY AS NEEDED FOR ANXIETY 30 tablet 0   diphenhydrAMINE  (BENADRYL ) 25 MG tablet Take 1 tablet (25 mg total) by mouth every 6 (six) hours as needed. 30 tablet 0   EPINEPHrine  0.3 mg/0.3 mL IJ SOAJ injection Inject 0.3 mg into the muscle as needed for anaphylaxis. 2 each 1   escitalopram  (LEXAPRO ) 10 MG tablet TAKE 1 TABLET BY MOUTH EVERY DAY 90 tablet 3   fluticasone  (FLONASE ) 50 MCG/ACT nasal spray Place 2 sprays into both nostrils daily. 16 g 6   ketoconazole  (NIZORAL ) 2 % cream Apply 1 Application topically daily. 45 g 1   LORATADINE ALLERGY RELIEF PO Take by mouth.     ondansetron  (ZOFRAN ) 4 MG tablet Take 1 tablet (4 mg total) by mouth every 6 (six) hours. 12 tablet 0   pantoprazole  (PROTONIX ) 20 MG tablet Take 1 tablet (20 mg total) by mouth daily. 90 tablet 3   triamcinolone cream (KENALOG) 0.1 % Apply topically 2 (two) times daily.     No current facility-administered medications on file prior to visit.    Allergies  Allergen Reactions   Other Anaphylaxis    Nuts--All Types   Bupropion Other (See Comments)    Made pt feel lethargic    Cetirizine Palpitations   Venlafaxine Palpitations    Past Medical History:  Diagnosis Date   Asthma    seasonal well controlled   Family history of adverse reaction to anesthesia     grandmother took days to wake up   GERD (gastroesophageal reflux disease)    Major depression in partial remission (HCC)    Morbid obesity (HCC)    Nasal polyposis    Obstructive sleep apnea    no C-Pap    Past Surgical History:  Procedure Laterality Date   ANKLE SURGERY Left 2003   drilling for new collagen   EYE SURGERY Bilateral    PRK   FRACTURE SURGERY     LAPAROSCOPIC ROUX-EN-Y GASTRIC BYPASS WITH HIATAL HERNIA REPAIR N/A 12/19/2018   Procedure: LAPAROSCOPIC ROUX-EN-Y GASTRIC BYPASS WITH HIATAL HERNIA REPAIR, Upper Endo-ERAS Pathway;  Surgeon: Aldean Hummingbird, MD;  Location: [facility];  Service: General;  Laterality: N/A;   NASAL POLYP EXCISION      Family History  Problem Relation Age of Onset   Lupus Father    Heart disease Father    Congestive Heart Failure Father    Lupus Brother    Diabetes Maternal Grandmother    Heart disease Paternal Grandmother    Cancer Neg Hx     Social History   Socioeconomic History   Marital status: Single    Spouse name: Not on file   Number of children: 0   Years of education: Not on file   Highest education level: Master's degree (e.g., MA, MS, MEng, MEd,  MSW, MBA)  Occupational History   Occupation: Full time nanny  Tobacco Use   Smoking status: Never    Passive exposure: Current   Smokeless tobacco: Never  Vaping Use   Vaping status: Never Used  Substance and Sexual Activity   Alcohol use: Yes    Comment: occasional   Drug use: Never   Sexual activity: Not on file  Other Topics Concern   Not on file  Social History Narrative   Not on file   Social Drivers of Health   Financial Resource Strain: Low Risk  (02/09/2023)   Overall Financial Resource Strain (CARDIA)    Difficulty of Paying Living Expenses: Not very hard  Food Insecurity: No Food Insecurity (02/09/2023)   Hunger Vital Sign    Worried About Running Out of Food in the Last Year: Never true    Ran Out of Food in the Last Year: Never true  Transportation  Needs: No Transportation Needs (02/09/2023)   PRAPARE - Administrator, Civil Service (Medical): No    Lack of Transportation (Non-Medical): No  Physical Activity: Insufficiently Active (02/09/2023)   Exercise Vital Sign    Days of Exercise per Week: 4 days    Minutes of Exercise per Session: 30 min  Stress: No Stress Concern Present (02/09/2023)   [employer] of Occupational Health - Occupational Stress Questionnaire    Feeling of Stress : Only a little  Social Connections: Unknown (02/09/2023)   Social Connection and Isolation Panel    Frequency of Communication with Friends and Family: Once a week    Frequency of Social Gatherings with Friends and Family: Patient declined    Attends Religious Services: 1 to 4 times per year    Active Member of [employer] or Organizations: No    Attends Engineer, structural: Not on file    Marital Status: Never married  Intimate Partner Violence: Not on file   Review of Systems  Constitutional:  Negative for fatigue and unexpected weight change.       Wears seat belt  HENT:  Negative for dental problem, hearing loss, tinnitus and trouble swallowing.        Overdue for dentist  Eyes:  Negative for visual disturbance.       No diplopia or unilateral vision loss  Respiratory:  Negative for cough and shortness of breath.        Albuterol  once a week or so--usually at night  Cardiovascular:  Negative for palpitations.       Gets some chest tightness at times at night---below right clavicle (better with yoga/stretching)---not acid  Gastrointestinal:  Negative for blood in stool and constipation.  Endocrine: Negative for polydipsia and polyuria.  Genitourinary:  Negative for dysuria and hematuria.       Periods regular--slightly increased pain/cramping/nausea on day 2 Uses heating pad, etc abstinent  Musculoskeletal:  Positive for arthralgias and joint swelling. Negative for back pain.       Ankles/knees swell  Skin:  Negative for  rash.       No suspicious lesions  Allergic/Immunologic: Positive for environmental allergies. Negative for immunocompromised state.  Neurological:  Negative for dizziness, syncope, light-headedness and headaches.  Hematological:  Negative for adenopathy. Does not bruise/bleed easily.  Psychiatric/Behavioral:         Mild sleep issues Depression is still controlled---still sees therapist Mild anxiety issues---has behavioral measures from therapist       Objective:   Physical Exam Constitutional:  Appearance: Normal appearance. She is obese.  HENT:     Mouth/Throat:     Pharynx: No oropharyngeal exudate or posterior oropharyngeal erythema.   Eyes:     Conjunctiva/sclera: Conjunctivae normal.     Pupils: Pupils are equal, round, and reactive to light.    Cardiovascular:     Rate and Rhythm: Normal rate and regular rhythm.     Pulses: Normal pulses.     Heart sounds: No murmur heard.    No gallop.  Pulmonary:     Effort: Pulmonary effort is normal.     Breath sounds: Normal breath sounds. No wheezing or rales.  Abdominal:     Palpations: Abdomen is soft.     Tenderness: There is no abdominal tenderness.   Musculoskeletal:     Cervical back: Neck supple.     Right lower leg: No edema.     Left lower leg: No edema.  Lymphadenopathy:     Cervical: No cervical adenopathy.   Skin:    Findings: No rash.   Neurological:     General: No focal deficit present.     Mental Status: She is alert and oriented to person, place, and time.   Psychiatric:        Mood and Affect: Mood normal.        Behavior: Behavior normal.            Assessment & Plan:

## 2023-06-28 NOTE — Assessment & Plan Note (Signed)
 Mild  Uses albuterol  about once a week

## 2023-06-28 NOTE — Assessment & Plan Note (Signed)
 No severe daytime symptoms off CPAP

## 2023-06-28 NOTE — Assessment & Plan Note (Signed)
 Has kept off 100# since bariatric surgery

## 2023-06-28 NOTE — Assessment & Plan Note (Signed)
 Doing okay---also mild anxiety Behavioral methods effective from counselor Continues escitalopram  10  Uses xanax  a little more lately

## 2023-06-28 NOTE — Assessment & Plan Note (Signed)
 Healthy Working on healthy eating and exercise Yearly mammogram Pap due 2026 Flu/COVID updates in fall

## 2023-06-30 ENCOUNTER — Other Ambulatory Visit: Payer: Self-pay | Admitting: Internal Medicine

## 2023-07-02 ENCOUNTER — Other Ambulatory Visit: Payer: Self-pay | Admitting: Internal Medicine

## 2023-08-19 ENCOUNTER — Other Ambulatory Visit: Payer: Self-pay | Admitting: Internal Medicine

## 2023-08-19 NOTE — Telephone Encounter (Signed)
 Last filled 05-12-23 Last OV 06-28-23 No Future OV [facility]

## 2023-08-26 ENCOUNTER — Encounter: Payer: Self-pay | Admitting: Internal Medicine

## 2023-09-28 ENCOUNTER — Other Ambulatory Visit: Payer: Self-pay

## 2023-09-28 MED ORDER — ALBUTEROL SULFATE HFA 108 (90 BASE) MCG/ACT IN AERS: INHALATION_SPRAY | RESPIRATORY_TRACT | 0 refills | Status: AC

## 2023-09-28 NOTE — Telephone Encounter (Signed)
 Rx sent electronically.
